# Patient Record
Sex: Female | Born: 1970 | Race: Asian | Hispanic: No | Marital: Married | Smoking: Never smoker
Health system: Southern US, Community
[De-identification: ages and names within clinical notes are randomized; demographics above are authoritative.]

## PROBLEM LIST (undated history)

## (undated) DIAGNOSIS — Z789 Other specified health status: Secondary | ICD-10-CM

---

## 1998-02-23 ENCOUNTER — Inpatient Hospital Stay (HOSPITAL_COMMUNITY): Admission: AD | Admit: 1998-02-23 | Discharge: 1998-02-23 | Payer: Self-pay | Admitting: *Deleted

## 2002-01-22 ENCOUNTER — Emergency Department (HOSPITAL_COMMUNITY): Admission: EM | Admit: 2002-01-22 | Discharge: 2002-01-23 | Payer: Self-pay | Admitting: Emergency Medicine

## 2002-01-23 ENCOUNTER — Encounter: Payer: Self-pay | Admitting: Emergency Medicine

## 2007-05-13 ENCOUNTER — Other Ambulatory Visit: Admission: RE | Admit: 2007-05-13 | Discharge: 2007-05-13 | Payer: Self-pay | Admitting: Gynecology

## 2009-01-23 ENCOUNTER — Emergency Department (HOSPITAL_COMMUNITY): Admission: EM | Admit: 2009-01-23 | Discharge: 2009-01-24 | Payer: Self-pay | Admitting: Emergency Medicine

## 2010-12-04 HISTORY — PX: INTRAUTERINE DEVICE INSERTION: SHX323

## 2011-03-21 LAB — URINALYSIS, ROUTINE W REFLEX MICROSCOPIC
Bilirubin Urine: NEGATIVE
Glucose, UA: NEGATIVE mg/dL
Hgb urine dipstick: NEGATIVE
Nitrite: POSITIVE — AB
Protein, ur: NEGATIVE mg/dL
Specific Gravity, Urine: 1.014 (ref 1.005–1.030)
Urobilinogen, UA: 1 mg/dL (ref 0.0–1.0)
pH: 5.5 (ref 5.0–8.0)

## 2011-03-21 LAB — PREGNANCY, URINE: Preg Test, Ur: NEGATIVE

## 2011-03-21 LAB — URINE MICROSCOPIC-ADD ON

## 2011-04-06 ENCOUNTER — Other Ambulatory Visit (HOSPITAL_COMMUNITY)
Admission: RE | Admit: 2011-04-06 | Discharge: 2011-04-06 | Disposition: A | Discharge status: Home / Self Care | Payer: Self-pay | Source: Ambulatory Visit | Attending: Obstetrics and Gynecology | Admitting: Obstetrics and Gynecology

## 2011-04-06 ENCOUNTER — Other Ambulatory Visit: Payer: Self-pay | Admitting: Obstetrics and Gynecology

## 2011-04-06 DIAGNOSIS — Z124 Encounter for screening for malignant neoplasm of cervix: Secondary | ICD-10-CM | POA: Insufficient documentation

## 2011-04-14 ENCOUNTER — Ambulatory Visit (HOSPITAL_COMMUNITY)
Admission: RE | Admit: 2011-04-14 | Discharge: 2011-04-14 | Disposition: A | Discharge status: Home / Self Care | Payer: BC Managed Care – PPO | Source: Ambulatory Visit | Attending: Obstetrics and Gynecology | Admitting: Obstetrics and Gynecology

## 2011-04-14 DIAGNOSIS — Z302 Encounter for sterilization: Secondary | ICD-10-CM | POA: Insufficient documentation

## 2011-04-14 LAB — CBC
HCT: 36.5 % (ref 36.0–46.0)
Hemoglobin: 11.9 g/dL — ABNORMAL LOW (ref 12.0–15.0)
MCH: 25.2 pg — ABNORMAL LOW (ref 26.0–34.0)
MCHC: 32.6 g/dL (ref 30.0–36.0)
MCV: 77.3 fL — ABNORMAL LOW (ref 78.0–100.0)
Platelets: 203 10*3/uL (ref 150–400)
RBC: 4.72 MIL/uL (ref 3.87–5.11)
RDW: 14.2 % (ref 11.5–15.5)
WBC: 8.1 10*3/uL (ref 4.0–10.5)

## 2011-05-29 NOTE — Op Note (Signed)
  [name], Tracy Branch NO.:  [id number]  MEDICAL RECORD NO.:  [id number]           PATIENT TYPE:  O  LOCATION:  [facility]                          [facility]AC[facility]  PHYSICIAN:  Arlyce Harman, MD     DATE OF BIRTH:  12/24/1970  DATE OF PROCEDURE:  04/14/2011 DATE OF DISCHARGE:    same                              OPERATIVE REPORT   PREOPERATIVE DIAGNOSES:  Laparoscopic tubal coagulation, failed Essure sterilization.  POSTOPERATIVE DIAGNOSES:  Laparoscopic tubal coagulation, failed Essure sterilization.  SURGEON:  Arlyce Harman, MD  PROCEDURE:  Laparoscopic tubal coagulation.  ASSISTANT:  None.  INDICATIONS AND CONSENT:  The patient is a 40 year old parous female who desires elective sterilization.  Risks, possible complication, failure rate, and irreversibility have been explained.  Informed consent was given.  The patient was in the office for Essure sterilization procedure, however, due to the very small diameter of the tubes in this  80 lb Asian petite female, the tubal ostia could only be partially cannulated.  And after several attempts, looking directly at the tubal ostia which allowed  entry then the device stopped before it could be adequately inserted, the procedure was abandoned and the patient was brought into [facility] for laparoscopic tubal coagulation.  PROCEDURE IN DETAIL:  The patient was placed on the table in the supine position.  General anesthesia was induced.  The bladder was drained with a Foley catheter and Hulka tenaculum was placed in the anterior lip of the cervix.  After this was done, we went above and an infraumbilical incision was made, extended to the abdominal layers using direct vision. The peritoneal cavity was entered and CO2 was insufflated.  A second puncture was placed and it was a 5-mm port in the suprapubic area.  The pelvis was visualized.  There was an anterior omental adhesion to the anterior abdominal wall.   The remainder of the uterus, tubes, and ovary appeared normal.  The Kleppinger was inserted and the left tube was identified, traced this, fimbriated, anticoagulated, and 3 successive areas were released.  The exact procedure was done on the other side. At this point, the procedure was terminated and the patient was awakened and taken to recovery in good condition.  SPECIMENS REMOVED:  None.     Arlyce Harman, MD     EG/MEDQ  D:  04/14/2011  T:  04/15/2011  Job:  [id number]  Electronically Signed by Arlyce Harman MD on 05/29/2011 10:36:16 AM

## 2014-03-17 ENCOUNTER — Encounter (HOSPITAL_BASED_OUTPATIENT_CLINIC_OR_DEPARTMENT_OTHER): Payer: Self-pay | Admitting: Emergency Medicine

## 2014-03-17 DIAGNOSIS — Z3202 Encounter for pregnancy test, result negative: Secondary | ICD-10-CM | POA: Insufficient documentation

## 2014-03-17 DIAGNOSIS — N390 Urinary tract infection, site not specified: Secondary | ICD-10-CM | POA: Insufficient documentation

## 2014-03-17 DIAGNOSIS — R109 Unspecified abdominal pain: Secondary | ICD-10-CM | POA: Insufficient documentation

## 2014-03-17 DIAGNOSIS — R509 Fever, unspecified: Secondary | ICD-10-CM | POA: Insufficient documentation

## 2014-03-17 DIAGNOSIS — Z792 Long term (current) use of antibiotics: Secondary | ICD-10-CM | POA: Insufficient documentation

## 2014-03-17 DIAGNOSIS — R52 Pain, unspecified: Secondary | ICD-10-CM | POA: Insufficient documentation

## 2014-03-17 LAB — URINALYSIS, ROUTINE W REFLEX MICROSCOPIC
Bilirubin Urine: NEGATIVE
Glucose, UA: NEGATIVE mg/dL
HGB URINE DIPSTICK: NEGATIVE
Ketones, ur: NEGATIVE mg/dL
LEUKOCYTES UA: NEGATIVE
Nitrite: NEGATIVE
PROTEIN: NEGATIVE mg/dL
Specific Gravity, Urine: 1.022 (ref 1.005–1.030)
UROBILINOGEN UA: 0.2 mg/dL (ref 0.0–1.0)
pH: 7 (ref 5.0–8.0)

## 2014-03-17 NOTE — ED Notes (Signed)
Family reports pt developed  Left flank pain on Friday, seen at urgent care, dx with uti, started on antibiotic, now with increased back pain, urgent care told patient to come be evaluated in er

## 2014-03-18 ENCOUNTER — Emergency Department (HOSPITAL_BASED_OUTPATIENT_CLINIC_OR_DEPARTMENT_OTHER): Payer: BC Managed Care – PPO

## 2014-03-18 ENCOUNTER — Emergency Department (HOSPITAL_BASED_OUTPATIENT_CLINIC_OR_DEPARTMENT_OTHER)
Admission: EM | Admit: 2014-03-18 | Discharge: 2014-03-18 | Disposition: A | Discharge status: Home / Self Care | Payer: BC Managed Care – PPO | Attending: Emergency Medicine | Admitting: Emergency Medicine

## 2014-03-18 DIAGNOSIS — R109 Unspecified abdominal pain: Secondary | ICD-10-CM

## 2014-03-18 LAB — COMPREHENSIVE METABOLIC PANEL
ALT: 13 U/L (ref 0–35)
AST: 21 U/L (ref 0–37)
Albumin: 4.4 g/dL (ref 3.5–5.2)
Alkaline Phosphatase: 55 U/L (ref 39–117)
BUN: 16 mg/dL (ref 6–23)
CALCIUM: 9.8 mg/dL (ref 8.4–10.5)
CO2: 26 meq/L (ref 19–32)
CREATININE: 0.7 mg/dL (ref 0.50–1.10)
Chloride: 99 mEq/L (ref 96–112)
GFR calc Af Amer: >90 mL/min (ref >90)
Glucose, Bld: 101 mg/dL — ABNORMAL HIGH (ref 70–99)
Potassium: 3.9 mEq/L (ref 3.7–5.3)
Sodium: 140 mEq/L (ref 137–147)
Total Bilirubin: 0.5 mg/dL (ref 0.3–1.2)
Total Protein: 8.6 g/dL — ABNORMAL HIGH (ref 6.0–8.3)

## 2014-03-18 LAB — CBC WITH DIFFERENTIAL/PLATELET
Basophils Absolute: 0 10*3/uL (ref 0.0–0.1)
Basophils Relative: 0 % (ref 0–1)
EOS ABS: 0.1 10*3/uL (ref 0.0–0.7)
Eosinophils Relative: 1 % (ref 0–5)
HCT: 36.8 % (ref 36.0–46.0)
HEMOGLOBIN: 12.5 g/dL (ref 12.0–15.0)
LYMPHS ABS: 2.4 10*3/uL (ref 0.7–4.0)
LYMPHS PCT: 36 % (ref 12–46)
MCH: 25.7 pg — AB (ref 26.0–34.0)
MCHC: 34 g/dL (ref 30.0–36.0)
MCV: 75.6 fL — ABNORMAL LOW (ref 78.0–100.0)
MONOS PCT: 9 % (ref 3–12)
Monocytes Absolute: 0.6 10*3/uL (ref 0.1–1.0)
NEUTROS ABS: 3.7 10*3/uL (ref 1.7–7.7)
NEUTROS PCT: 54 % (ref 43–77)
Platelets: 227 10*3/uL (ref 150–400)
RBC: 4.87 MIL/uL (ref 3.87–5.11)
RDW: 13.4 % (ref 11.5–15.5)
WBC: 6.8 10*3/uL (ref 4.0–10.5)

## 2014-03-18 LAB — CK: Total CK: 90 U/L (ref 7–177)

## 2014-03-18 LAB — PREGNANCY, URINE: PREG TEST UR: NEGATIVE

## 2014-03-18 MED ORDER — HYDROCODONE-ACETAMINOPHEN 5-325 MG PO TABS
1.0000 | ORAL_TABLET | Freq: Once | ORAL | Status: AC
  Administered 2014-03-18: 1 via ORAL
  Filled 2014-03-18: qty 1

## 2014-03-18 MED ORDER — HYDROCODONE-ACETAMINOPHEN 5-325 MG PO TABS: 0.5000 | ORAL_TABLET | ORAL | Status: DC | PRN

## 2014-03-18 NOTE — ED Notes (Signed)
Left flank pain, increasing since taking antibiotics.

## 2014-03-18 NOTE — Discharge Instructions (Signed)
Your pain may be due to an object sitting next to your uterus, but not inside the uterus. On CT scan this appears to be an IUD. [facility] contact [facility] to schedule followup appointment for evaluation. Her leg pain may be an adverse reaction to your antibiotic; please discontinue this antibiotic as there is no evidence of a urinary tract infection.

## 2014-03-18 NOTE — ED Notes (Signed)
Patient transported to CT 

## 2014-03-18 NOTE — ED Provider Notes (Signed)
CSN: [id number]     Arrival date & time 03/17/14  2245 History   First MD Initiated Contact with Patient 03/18/14 0254     Chief Complaint  Patient presents with  . Pain in Legs      (Consider location/radiation/quality/duration/timing/severity/associated sxs/prior Treatment) HPI This is a 43 year old female with a five-day history of left flank pain. This has been associated with subjective fever and chills. She was seen in urgent care yesterday and diagnosed with a urinary tract infection. She was given a prescription for Bactrim. She took her first tablet yesterday evening. About 3 hours after taking the Bactrim she developed pain in her calves bilaterally. She describes this as a tightness or pulling. It is worse with palpation or ambulation. She states it makes ambulation difficult. There is no associated swelling. There is no associated weakness. She states the pain in her calves is worse than the pain in her left flank. It is described as moderate to severe.  History reviewed. No pertinent past medical history. History reviewed. No pertinent past surgical history. History reviewed. No pertinent family history. History  Substance Use Topics  . Smoking status: Never Smoker   . Smokeless tobacco: Not on file  . Alcohol Use: No   OB History   Grav Para Term Preterm Abortions TAB SAB Ect Mult Living                 Review of Systems  All other systems reviewed and are negative.     Allergies  Review of patient's allergies indicates no known allergies.  Home Medications   Prior to Admission medications   Medication Sig Start Date End Date Taking? Authorizing Provider  sulfamethoxazole-trimethoprim (BACTRIM DS,SEPTRA DS) 800-160 MG per tablet Take 1 tablet by mouth 2 (two) times daily.   Yes Historical Provider, MD   BP 97/54  Temp(Src) 98.6 F (37 C) (Oral)  Resp 20  Ht 5' (1.524 m)  Wt 99 lb (44.906 kg)  BMI 19.33 kg/m2  SpO2 100%  LMP 02/18/2014  Physical  Exam General: Well-developed, well-nourished female in no acute distress; appearance consistent with age of record HENT: normocephalic; atraumatic Eyes: pupils equal, round and reactive to light; extraocular muscles intact Neck: supple Heart: regular rate and rhythm Lungs: clear to auscultation bilaterally Abdomen: soft; nondistended; nontender; no masses or hepatosplenomegaly; bowel sounds present GU: left CVA tendernes Extremities: No deformity; full range of motion; pulses normal; no edema; tenderness of calf muscles Neurologic: Awake, alert; motor function intact in all extremities and symmetric; no facial droop; normal gait, coordination and speech Skin: Warm and dry    ED Course  Procedures (including critical care time)  MDM   Nursing notes and vitals signs, including pulse oximetry, reviewed.  Summary of this visit's results, reviewed by myself:  Labs:  Results for orders placed during the hospital encounter of 03/18/14 (from the past 24 hour(s))  URINALYSIS, ROUTINE W REFLEX MICROSCOPIC     Status: Abnormal   Collection Time    03/17/14 10:58 PM      Result Value Ref Range   Color, Urine YELLOW  YELLOW   APPearance CLOUDY (*) CLEAR   Specific Gravity, Urine 1.022  1.005 - 1.030   pH 7.0  5.0 - 8.0   Glucose, UA NEGATIVE  NEGATIVE mg/dL   Hgb urine dipstick NEGATIVE  NEGATIVE   Bilirubin Urine NEGATIVE  NEGATIVE   Ketones, ur NEGATIVE  NEGATIVE mg/dL   Protein, ur NEGATIVE  NEGATIVE mg/dL   Urobilinogen,  UA 0.2  0.0 - 1.0 mg/dL   Nitrite NEGATIVE  NEGATIVE   Leukocytes, UA NEGATIVE  NEGATIVE  PREGNANCY, URINE     Status: None   Collection Time    03/17/14 10:58 PM      Result Value Ref Range   Preg Test, Ur NEGATIVE  NEGATIVE  COMPREHENSIVE METABOLIC PANEL     Status: Abnormal   Collection Time    03/18/14  3:21 AM      Result Value Ref Range   Sodium 140  137 - 147 mEq/L   Potassium 3.9  3.7 - 5.3 mEq/L   Chloride 99  96 - 112 mEq/L   CO2 26  19 - 32  mEq/L   Glucose, Bld 101 (*) 70 - 99 mg/dL   BUN 16  6 - 23 mg/dL   Creatinine, Ser 9.60  0.50 - 1.10 mg/dL   Calcium 9.8  8.4 - 45.4 mg/dL   Total Protein 8.6 (*) 6.0 - 8.3 g/dL   Albumin 4.4  3.5 - 5.2 g/dL   AST 21  0 - 37 U/L   ALT 13  0 - 35 U/L   Alkaline Phosphatase 55  39 - 117 U/L   Total Bilirubin 0.5  0.3 - 1.2 mg/dL   GFR calc non Af Amer >90  >90 mL/min   GFR calc Af Amer >90  >90 mL/min  CBC WITH DIFFERENTIAL     Status: Abnormal   Collection Time    03/18/14  3:21 AM      Result Value Ref Range   WBC 6.8  4.0 - 10.5 K/uL   RBC 4.87  3.87 - 5.11 MIL/uL   Hemoglobin 12.5  12.0 - 15.0 g/dL   HCT 09.8  11.9 - 14.7 %   MCV 75.6 (*) 78.0 - 100.0 fL   MCH 25.7 (*) 26.0 - 34.0 pg   MCHC 34.0  30.0 - 36.0 g/dL   RDW 82.9  56.2 - 13.0 %   Platelets 227  150 - 400 K/uL   Neutrophils Relative % 54  43 - 77 %   Neutro Abs 3.7  1.7 - 7.7 K/uL   Lymphocytes Relative 36  12 - 46 %   Lymphs Abs 2.4  0.7 - 4.0 K/uL   Monocytes Relative 9  3 - 12 %   Monocytes Absolute 0.6  0.1 - 1.0 K/uL   Eosinophils Relative 1  0 - 5 %   Eosinophils Absolute 0.1  0.0 - 0.7 K/uL   Basophils Relative 0  0 - 1 %   Basophils Absolute 0.0  0.0 - 0.1 K/uL  CK     Status: None   Collection Time    03/18/14  3:21 AM      Result Value Ref Range   Total CK 90  7 - 177 U/L    Imaging Studies: Ct Abdomen Pelvis Wo Contrast  03/18/2014   CLINICAL DATA:  Left flank pain  EXAM: CT ABDOMEN AND PELVIS WITHOUT CONTRAST  TECHNIQUE: Multidetector CT imaging of the abdomen and pelvis was performed following the standard protocol without intravenous contrast.  COMPARISON:  None.  FINDINGS: The lung bases are clear.  No renal, ureteral, or bladder calculi. No obstructive uropathy. No perinephric stranding is seen. The kidneys are symmetric in size without evidence for exophytic mass. The bladder is unremarkable.  The liver demonstrates no focal abnormality. The gallbladder is unremarkable. The spleen  demonstrates no focal abnormality. The adrenal glands and pancreas  are normal.  The unopacified stomach, duodenum, small intestine and large intestine are unremarkable, but evaluation is limited by lack of oral contrast. There is no pneumoperitoneum, pneumatosis, or portal venous gas. There is no abdominal or pelvic free fluid. There is no lymphadenopathy. There is a malpositioned intrauterine device which appears to the outside uterus located along the left lateral aspect of the uterus.  The abdominal aorta is normal in caliber .  The osseous structures are unremarkable.  IMPRESSION: Malpositioned intrauterine device which is outside the uterus abutting the left lateral aspect of the uterus.   Electronically Signed   By: Elige KoHetal  Patel   On: 03/18/2014 03:27   4:08 AM The patient states she has never had an IUD. An OB/GYN attempted to place Essure 3 years ago but was unsuccessful and performed a bilateral tubal ligation with cauterization. No mention was made about an IUD. It is suspicious that the patient's pain is on the left side. There is no evidence of ureteral stone or urinary tract infection. Her muscle pain is likely due to an adverse reaction to the Bactrim and we will have her discontinue it as there is no evidence of a urinary tract infection. We will treat her pain and refer to [facility] for further evaluation and treatment.     Hanley SeamenJohn L Mignonne Afonso, MD 03/18/14 [phone]

## 2014-03-18 NOTE — ED Notes (Signed)
MD at bedside. 

## 2014-03-18 NOTE — ED Notes (Signed)
MD at bedside, findings discussed at length with pt and family.

## 2014-03-19 ENCOUNTER — Ambulatory Visit (HOSPITAL_COMMUNITY)
Admission: RE | Admit: 2014-03-19 | Discharge: 2014-03-19 | Disposition: A | Discharge status: Home / Self Care | Payer: BC Managed Care – PPO | Source: Ambulatory Visit | Attending: Obstetrics & Gynecology | Admitting: Obstetrics & Gynecology

## 2014-03-19 ENCOUNTER — Ambulatory Visit (INDEPENDENT_AMBULATORY_CARE_PROVIDER_SITE_OTHER): Payer: BC Managed Care – PPO | Admitting: Obstetrics & Gynecology

## 2014-03-19 ENCOUNTER — Encounter: Payer: Self-pay | Admitting: Obstetrics & Gynecology

## 2014-03-19 ENCOUNTER — Other Ambulatory Visit: Payer: Self-pay | Admitting: Obstetrics & Gynecology

## 2014-03-19 VITALS — BP 122/84 | HR 96 | Temp 96.5°F | Ht 59.0 in | Wt 97.3 lb

## 2014-03-19 DIAGNOSIS — T8332XA Displacement of intrauterine contraceptive device, initial encounter: Secondary | ICD-10-CM

## 2014-03-19 DIAGNOSIS — T8339XA Other mechanical complication of intrauterine contraceptive device, initial encounter: Secondary | ICD-10-CM

## 2014-03-19 DIAGNOSIS — Z30431 Encounter for routine checking of intrauterine contraceptive device: Secondary | ICD-10-CM | POA: Insufficient documentation

## 2014-03-19 NOTE — Patient Instructions (Signed)
Intrauterine Device Information An intrauterine device (IUD) is inserted into your uterus to prevent pregnancy. There are two types of IUDs available:   Copper IUD This type of IUD is wrapped in copper wire and is placed inside the uterus. Copper makes the uterus and fallopian tubes produce a fluid that kills sperm. The copper IUD can stay in place for 10 years.  Hormone IUD This type of IUD contains the hormone progestin (synthetic progesterone). The hormone thickens the cervical mucus and prevents sperm from entering the uterus. It also thins the uterine lining to prevent implantation of a fertilized egg. The hormone can weaken or kill the sperm that get into the uterus. One type of hormone IUD can stay in place for 5 years, and another type can stay in place for 3 years. Your health care provider will make sure you are a good candidate for a contraceptive IUD. Discuss with your health care provider the possible side effects.  ADVANTAGES OF AN INTRAUTERINE DEVICE  IUDs are highly effective, reversible, long acting, and low maintenance.   There are no estrogen-related side effects.   An IUD can be used when breastfeeding.   IUDs are not associated with weight gain.   The copper IUD works immediately after insertion.   The hormone IUD works right away if inserted within 7 days of your period starting. You will need to use a backup method of birth control for 7 days if the hormone IUD is inserted at any other time in your cycle.  The copper IUD does not interfere with your female hormones.   The hormone IUD can make heavy menstrual periods lighter and decrease cramping.   The hormone IUD can be used for 3 or 5 years.   The copper IUD can be used for 10 years. DISADVANTAGES OF AN INTRAUTERINE DEVICE  The hormone IUD can be associated with irregular bleeding patterns.   The copper IUD can make your menstrual flow heavier and more painful.   You may experience cramping and  vaginal bleeding after insertion.  Document Released: 10/24/2004 Document Revised: 07/23/2013 Document Reviewed: 05/11/2013 [employer] Patient Information 2014 [employer].  

## 2014-03-19 NOTE — Progress Notes (Signed)
Subjective:     Patient ID: Tracy Branch, female   DOB: Oct 04, 1971, 43 y.o.   MRN: [id number]  HPI Pt was seen yesterday for abd/pelvic pain. A CT scan was done which revealed that the IUD appears to be outside of the uterus.  Pt reports that the IUD was placed over 3 years ago.  She denies abnormal bleeding.  Interpretation was difficult as the phone interpreter could not hear and her daughter does not speak her language well and she does not speak [place] fluently.  Pt denies N/V/F/C    Review of Systems     Objective:   Physical Exam BP 122/84  Pulse 96  Temp(Src) 96.5 F (35.8 C) (Oral)  Ht 4\' 11"  (1.499 m)  Wt 97 lb 4.8 oz (44.135 kg)  BMI 19.64 kg/m2  LMP 02/18/2014  Pt in NAD Abd; soft, NT, ND.  well healed transverse incision GU: EGBUS: no lesions Vagina: no blood in vault Cervix: no lesion; no mucopurulent d/c; no strings noted Uterus: small, mobile Adnexa: no masses; non tender   03/18/2014 EXAM:  CT ABDOMEN AND PELVIS WITHOUT CONTRAST  TECHNIQUE:  Multidetector CT imaging of the abdomen and pelvis was performed  following the standard protocol without intravenous contrast.  COMPARISON: None.  FINDINGS:  The lung bases are clear.  No renal, ureteral, or bladder calculi. No obstructive uropathy. No  perinephric stranding is seen. The kidneys are symmetric in size  without evidence for exophytic mass. The bladder is unremarkable.  The liver demonstrates no focal abnormality. The gallbladder is  unremarkable. The spleen demonstrates no focal abnormality. The  adrenal glands and pancreas are normal.  The unopacified stomach, duodenum, small intestine and large  intestine are unremarkable, but evaluation is limited by lack of  oral contrast. There is no pneumoperitoneum, pneumatosis, or portal  venous gas. There is no abdominal or pelvic free fluid. There is no  lymphadenopathy. There is a malpositioned intrauterine device which  appears to the outside  uterus located along the left lateral aspect  of the uterus.  The abdominal aorta is normal in caliber .  The osseous structures are unremarkable.  IMPRESSION:  Malpositioned intrauterine device which is outside the uterus  abutting the left lateral aspect of the uterus.  .      Assessment:     Pt referred for possible IUD in pelvis OUTSIDE of uterus    Plan:     Refer for 3D U/S now  Addendum: Pt s/p 3 D sono.  There is an IUD in the myometrium extending THROUGH to the pelvis.  It appears that the long axis is the wall and one of the arms is through the wall.  There is NO intrauterine IUD. (final results pending.  CT and sono results reviewed in person with the radiologist).  Patient desires surgical management with laparoscopy for removal of foreign body. She was told that she will be contacted by our surgical scheduler regarding the time and date of her surgery; routine preoperative instructions of having nothing to eat or drink after midnight on the day prior to surgery and also coming to the hospital 1 1/2 hours prior to her time of surgery were also emphasized.  She was told she may be called for a preoperative appointment about a week prior to surgery and will be given further preoperative instructions at that visit. Printed patient education handouts about the procedure were given to the patient to review at home.  Full consent was not possible due  to lack of suitable interpreter.  Have requested an in person interpreter at the time of surgery.

## 2014-03-24 ENCOUNTER — Ambulatory Visit (HOSPITAL_COMMUNITY): Payer: BC Managed Care – PPO | Admitting: Certified Registered Nurse Anesthetist

## 2014-03-24 ENCOUNTER — Encounter (HOSPITAL_COMMUNITY): Payer: BC Managed Care – PPO | Admitting: Certified Registered Nurse Anesthetist

## 2014-03-24 ENCOUNTER — Ambulatory Visit (HOSPITAL_COMMUNITY)
Admission: RE | Admit: 2014-03-24 | Discharge: 2014-03-24 | Disposition: A | Discharge status: Home / Self Care | Payer: BC Managed Care – PPO | Source: Ambulatory Visit | Attending: Obstetrics & Gynecology | Admitting: Obstetrics & Gynecology

## 2014-03-24 ENCOUNTER — Encounter (HOSPITAL_COMMUNITY): Payer: Self-pay | Admitting: *Deleted

## 2014-03-24 ENCOUNTER — Encounter (HOSPITAL_COMMUNITY)
Admission: RE | Disposition: A | Discharge status: Home / Self Care | Payer: Self-pay | Source: Ambulatory Visit | Attending: Obstetrics & Gynecology

## 2014-03-24 DIAGNOSIS — N949 Unspecified condition associated with female genital organs and menstrual cycle: Secondary | ICD-10-CM | POA: Insufficient documentation

## 2014-03-24 DIAGNOSIS — Z3049 Encounter for surveillance of other contraceptives: Secondary | ICD-10-CM | POA: Insufficient documentation

## 2014-03-24 DIAGNOSIS — R102 Pelvic and perineal pain unspecified side: Secondary | ICD-10-CM

## 2014-03-24 DIAGNOSIS — T193XXA Foreign body in uterus, initial encounter: Secondary | ICD-10-CM

## 2014-03-24 DIAGNOSIS — S3760XA Unspecified injury of uterus, initial encounter: Secondary | ICD-10-CM | POA: Insufficient documentation

## 2014-03-24 HISTORY — DX: Other specified health status: Z78.9

## 2014-03-24 HISTORY — PX: LAPAROSCOPY: SHX197

## 2014-03-24 LAB — CBC
HEMATOCRIT: 36 % (ref 36.0–46.0)
HEMOGLOBIN: 12 g/dL (ref 12.0–15.0)
MCH: 25.2 pg — ABNORMAL LOW (ref 26.0–34.0)
MCHC: 33.3 g/dL (ref 30.0–36.0)
MCV: 75.6 fL — AB (ref 78.0–100.0)
Platelets: 247 10*3/uL (ref 150–400)
RBC: 4.76 MIL/uL (ref 3.87–5.11)
RDW: 13.3 % (ref 11.5–15.5)
WBC: 6.7 10*3/uL (ref 4.0–10.5)

## 2014-03-24 SURGERY — LAPAROSCOPY OPERATIVE
Anesthesia: General | Site: Abdomen

## 2014-03-24 MED ORDER — ROCURONIUM BROMIDE 100 MG/10ML IV SOLN
INTRAVENOUS | Status: DC | PRN
  Administered 2014-03-24: 20 mg via INTRAVENOUS

## 2014-03-24 MED ORDER — NEOSTIGMINE METHYLSULFATE 1 MG/ML IJ SOLN
INTRAMUSCULAR | Status: AC
  Filled 2014-03-24: qty 1

## 2014-03-24 MED ORDER — KETOROLAC TROMETHAMINE 30 MG/ML IJ SOLN: 15.0000 mg | Freq: Once | INTRAMUSCULAR | Status: DC | PRN

## 2014-03-24 MED ORDER — KETOROLAC TROMETHAMINE 30 MG/ML IJ SOLN
INTRAMUSCULAR | Status: DC | PRN
  Administered 2014-03-24: 30 mg via INTRAVENOUS

## 2014-03-24 MED ORDER — BUPIVACAINE HCL (PF) 0.5 % IJ SOLN
INTRAMUSCULAR | Status: DC | PRN
  Administered 2014-03-24: 19 mL

## 2014-03-24 MED ORDER — NEOSTIGMINE METHYLSULFATE 1 MG/ML IJ SOLN
INTRAMUSCULAR | Status: DC | PRN
  Administered 2014-03-24: 2 mg via INTRAVENOUS

## 2014-03-24 MED ORDER — BUPIVACAINE HCL (PF) 0.5 % IJ SOLN
INTRAMUSCULAR | Status: AC
  Filled 2014-03-24: qty 30

## 2014-03-24 MED ORDER — BUPIVACAINE HCL (PF) 0.25 % IJ SOLN
INTRAMUSCULAR | Status: AC
  Filled 2014-03-24: qty 30

## 2014-03-24 MED ORDER — LACTATED RINGERS IV SOLN
INTRAVENOUS | Status: DC
  Administered 2014-03-24 (×2): via INTRAVENOUS

## 2014-03-24 MED ORDER — GLYCOPYRROLATE 0.2 MG/ML IJ SOLN
INTRAMUSCULAR | Status: DC | PRN
  Administered 2014-03-24: 0.2 mg via INTRAVENOUS
  Administered 2014-03-24: .4 mg via INTRAVENOUS

## 2014-03-24 MED ORDER — MEPERIDINE HCL 25 MG/ML IJ SOLN: 6.2500 mg | INTRAMUSCULAR | Status: DC | PRN

## 2014-03-24 MED ORDER — SODIUM CHLORIDE 0.9 % IJ SOLN
INTRAMUSCULAR | Status: DC | PRN
  Administered 2014-03-24: 3 mL

## 2014-03-24 MED ORDER — ONDANSETRON HCL 4 MG/2ML IJ SOLN
INTRAMUSCULAR | Status: AC
  Filled 2014-03-24: qty 2

## 2014-03-24 MED ORDER — SODIUM CHLORIDE 0.9 % IJ SOLN
INTRAMUSCULAR | Status: AC
  Filled 2014-03-24: qty 10

## 2014-03-24 MED ORDER — KETOROLAC TROMETHAMINE 30 MG/ML IJ SOLN
INTRAMUSCULAR | Status: AC
  Filled 2014-03-24: qty 1

## 2014-03-24 MED ORDER — GLYCOPYRROLATE 0.2 MG/ML IJ SOLN
INTRAMUSCULAR | Status: AC
  Filled 2014-03-24: qty 3

## 2014-03-24 MED ORDER — FENTANYL CITRATE 0.05 MG/ML IJ SOLN
INTRAMUSCULAR | Status: AC
  Filled 2014-03-24: qty 2

## 2014-03-24 MED ORDER — ROCURONIUM BROMIDE 100 MG/10ML IV SOLN
INTRAVENOUS | Status: AC
  Filled 2014-03-24: qty 1

## 2014-03-24 MED ORDER — PROPOFOL 10 MG/ML IV EMUL
INTRAVENOUS | Status: AC
  Filled 2014-03-24: qty 20

## 2014-03-24 MED ORDER — LIDOCAINE HCL (CARDIAC) 20 MG/ML IV SOLN
INTRAVENOUS | Status: AC
  Filled 2014-03-24: qty 5

## 2014-03-24 MED ORDER — MIDAZOLAM HCL 2 MG/2ML IJ SOLN
INTRAMUSCULAR | Status: AC
  Filled 2014-03-24: qty 2

## 2014-03-24 MED ORDER — FENTANYL CITRATE 0.05 MG/ML IJ SOLN
25.0000 ug | INTRAMUSCULAR | Status: DC | PRN
  Administered 2014-03-24: 25 ug via INTRAVENOUS

## 2014-03-24 MED ORDER — PROPOFOL 10 MG/ML IV BOLUS
INTRAVENOUS | Status: DC | PRN
  Administered 2014-03-24: 120 mg via INTRAVENOUS

## 2014-03-24 MED ORDER — FENTANYL CITRATE 0.05 MG/ML IJ SOLN
INTRAMUSCULAR | Status: AC
  Filled 2014-03-24: qty 5

## 2014-03-24 MED ORDER — LIDOCAINE HCL (CARDIAC) 20 MG/ML IV SOLN
INTRAVENOUS | Status: DC | PRN
  Administered 2014-03-24: 40 mg via INTRAVENOUS

## 2014-03-24 MED ORDER — IBUPROFEN 600 MG PO TABS: 600.0000 mg | ORAL_TABLET | Freq: Four times a day (QID) | ORAL | Status: AC | PRN

## 2014-03-24 MED ORDER — ONDANSETRON HCL 4 MG/2ML IJ SOLN: 4.0000 mg | Freq: Once | INTRAMUSCULAR | Status: DC | PRN

## 2014-03-24 MED ORDER — FENTANYL CITRATE 0.05 MG/ML IJ SOLN
INTRAMUSCULAR | Status: DC | PRN
  Administered 2014-03-24 (×3): 50 ug via INTRAVENOUS

## 2014-03-24 MED ORDER — DEXAMETHASONE SODIUM PHOSPHATE 10 MG/ML IJ SOLN
INTRAMUSCULAR | Status: AC
  Filled 2014-03-24: qty 1

## 2014-03-24 MED ORDER — ONDANSETRON HCL 4 MG/2ML IJ SOLN
INTRAMUSCULAR | Status: DC | PRN
  Administered 2014-03-24: 4 mg via INTRAVENOUS

## 2014-03-24 MED ORDER — MIDAZOLAM HCL 2 MG/2ML IJ SOLN
INTRAMUSCULAR | Status: DC | PRN
  Administered 2014-03-24: 1 mg via INTRAVENOUS

## 2014-03-24 MED ORDER — DEXAMETHASONE SODIUM PHOSPHATE 10 MG/ML IJ SOLN
INTRAMUSCULAR | Status: DC | PRN
  Administered 2014-03-24: 5 mg via INTRAVENOUS

## 2014-03-24 SURGICAL SUPPLY — 35 items
APL SKNCLS STERI-STRIP NONHPOA (GAUZE/BANDAGES/DRESSINGS) ×1
BAG SPEC RTRVL LRG 6X4 10 (ENDOMECHANICALS)
BENZOIN TINCTURE PRP APPL 2/3 (GAUZE/BANDAGES/DRESSINGS) ×4 IMPLANT
CABLE HIGH FREQUENCY MONO STRZ (ELECTRODE) IMPLANT
CATH ROBINSON RED A/P 16FR (CATHETERS) ×4 IMPLANT
CHLORAPREP W/TINT 26ML (MISCELLANEOUS) ×4 IMPLANT
CLOSURE WOUND 1/2 X4 (GAUZE/BANDAGES/DRESSINGS) ×1
CLOTH BEACON ORANGE TIMEOUT ST (SAFETY) ×4 IMPLANT
CONTAINER PREFILL 10% NBF 60ML (FORM) ×4 IMPLANT
GLOVE BIO SURGEON STRL SZ7 (GLOVE) ×4 IMPLANT
GLOVE BIOGEL PI IND STRL 7.0 (GLOVE) ×2 IMPLANT
GLOVE BIOGEL PI INDICATOR 7.0 (GLOVE) ×2
GOWN STRL REUS W/TWL LRG LVL3 (GOWN DISPOSABLE) ×8 IMPLANT
MANIPULATOR UTERINE 4.5 ZUMI (MISCELLANEOUS) ×4 IMPLANT
NEEDLE INSUFFLATION 120MM (ENDOMECHANICALS) ×4 IMPLANT
NEEDLE SPNL 22GX3.5 QUINCKE BK (NEEDLE) IMPLANT
NS IRRIG 1000ML POUR BTL (IV SOLUTION) ×4 IMPLANT
PACK LAPAROSCOPY BASIN (CUSTOM PROCEDURE TRAY) ×4 IMPLANT
PACK VAGINAL MINOR WOMEN LF (CUSTOM PROCEDURE TRAY) ×4 IMPLANT
PAD PREP 24X48 CUFFED NSTRL (MISCELLANEOUS) ×4 IMPLANT
POUCH SPECIMEN RETRIEVAL 10MM (ENDOMECHANICALS) IMPLANT
PROTECTOR NERVE ULNAR (MISCELLANEOUS) ×4 IMPLANT
SET IRRIG TUBING LAPAROSCOPIC (IRRIGATION / IRRIGATOR) IMPLANT
SHEARS HARMONIC ACE PLUS 36CM (ENDOMECHANICALS) ×4 IMPLANT
STRIP CLOSURE SKIN 1/2X4 (GAUZE/BANDAGES/DRESSINGS) ×3 IMPLANT
SUT VIC AB 3-0 X1 27 (SUTURE) ×4 IMPLANT
SUT VICRYL 0 UR6 27IN ABS (SUTURE) ×4 IMPLANT
SUT VICRYL 4-0 PS2 18IN ABS (SUTURE) IMPLANT
SYR CONTROL 10ML LL (SYRINGE) ×4 IMPLANT
TOWEL OR 17X24 6PK STRL BLUE (TOWEL DISPOSABLE) ×8 IMPLANT
TRAY FOLEY CATH 14FR (SET/KITS/TRAYS/PACK) IMPLANT
TROCAR XCEL OPT SLVE 5M 100M (ENDOMECHANICALS) ×4 IMPLANT
TROCAR Z-THREAD BLADED 11X100M (TROCAR) ×4 IMPLANT
TROCAR Z-THREAD BLADED 5X100MM (TROCAR) ×4 IMPLANT
WATER STERILE IRR 1000ML POUR (IV SOLUTION) IMPLANT

## 2014-03-24 NOTE — Op Note (Signed)
Tracy SanesChanthana Olvey  03/24/2014  2:24 PM  PATIENT:  Tracy Branch Lwin  43 y.o. female  PRE-OPERATIVE DIAGNOSIS:  MALPLACED IUD  POST-OPERATIVE DIAGNOSIS:  Malplaced Esure Coil   PROCEDURE:  Procedure(s): LAPAROSCOPY with Esure coil removal (Left); lysis of adhesions  SURGEON:  Surgeon(s) and Role:    * Willodean Rosenthalarolyn Harraway-Smith, MD - Primary  ANESTHESIA:   general  EBL:  Total I/O In: 1100 [I.V.:1100] Out: 85 [Urine:75; Blood:10]  BLOOD ADMINISTERED:none  DRAINS: none   LOCAL MEDICATIONS USED:  MARCAINE     SPECIMEN:  Source of Specimen:  left cornual region Essure coil  DISPOSITION OF SPECIMEN:  PATHOLOGY  COUNTS:  YES  TOURNIQUET:  * No tourniquets in log *  DICTATION: .Note written in EPIC  PLAN OF CARE: Discharge to home after PACU  PATIENT DISPOSITION:  PACU - hemodynamically stable.   Delay start of Pharmacological VTE agent (>24hrs) due to surgical blood loss or risk of bleeding: not applicable  INDICATIONS: 43 y.o. Presents for removal of perforated foreign body from left lateral aspect of uterus.  Please refer to preoperative notes for more details. Patient was counseled regarding need for laparoscopic resection of what she said was an IUD.  Risks of surgery including bleeding which may require transfusion or reoperation, infection, injury to bowel or other surrounding organs, need for additional procedures including laparotomy and other postoperative/anesthesia complications were explained to patient.  Written informed consent was obtained.  FINDINGS:  ESSURE coil noted perforating the left cornual region.  There was also adhesions of omentum to the anterior abdominal wall.   COMPLICATIONS: None immediate  PROCEDURE IN DETAIL:  The patient was taken to the operating room where general anesthesia was administered and was found to be adequate.  She was placed in the dorsal lithotomy position, and was prepped and draped in a sterile manner.  Her bladder  was drained with a red rubber catheter. A uterine manipulator was then advanced into the uterus .  After an adequate timeout was performed, attention was then turned to the patient's abdomen where a 5-mm skin incision was made on the umbilical fold.  The Veress needle was carefully introduced into the peritoneal cavity at a 45-degree angle into the abdominal wall.  Intraperitoneal placement was confirmed by drop in intraabdominal pressure with insufflation of carbon dioxide gas.  Adequate pneumoperitoneum was obtained, and the 5-mm trocar and sleeve were then advanced without difficulty into the abdomen where intraabdominal placement was confirmed by the laparoscope. A survey of the patient's pelvis and abdomen revealed the findings as above.  A 5-mm left lower quadrant port was placed under direct visualization.  The omental adhesions were taken down with sharp dissection.  Attention was then turned to the left cornual region where the coil was grasped with forceps and removed from the uterus and fallopian tube. Excellent hemostasis was noted.  The abdomen was desufflated, and all instruments were removed.  All skin incisions were closed with with benzoin and steri-strips   The patient tolerated the procedure well.  All instruments, needles, and sponge counts were correct x 2. The patient was taken to the recovery room in stable condition.   The patient will be discharged to home as per PACU criteria.  Routine postoperative instructions given.  She was prescribed Ibuprofen.   She will follow up in the clinic in about 2 weeks for postoperative evaluation.

## 2014-03-24 NOTE — Anesthesia Postprocedure Evaluation (Signed)
Anesthesia Post Note  Patient: Tracy Branch  Procedure(s) Performed: Procedure(s) (LRB): LAPAROSCOPY with Esure coil removal (Left)  Anesthesia type: General  Patient location: PACU  Post pain: Pain level controlled  Post assessment: Post-op Vital signs reviewed  Last Vitals:  Filed Vitals:   03/24/14 1500  BP: 106/52  Pulse: 54  Temp:   Resp: 20    Post vital signs: Reviewed  Level of consciousness: sedated  Complications: No apparent anesthesia complications

## 2014-03-24 NOTE — Anesthesia Preprocedure Evaluation (Signed)
Anesthesia Evaluation  Patient identified by MRN, date of birth, ID band Patient awake    Reviewed: Allergy & Precautions, H&P , NPO status , Patient's Chart, lab work & pertinent test results  Airway Mallampati: I TM Distance: >3 FB Neck ROM: full    Dental no notable dental hx. (+) Teeth Intact   Pulmonary neg pulmonary ROS,    Pulmonary exam normal       Cardiovascular negative cardio ROS      Neuro/Psych negative neurological ROS  negative psych ROS   GI/Hepatic negative GI ROS, Neg liver ROS,   Endo/Other  negative endocrine ROS  Renal/GU negative Renal ROS     Musculoskeletal   Abdominal Normal abdominal exam  (+)   Peds  Hematology negative hematology ROS (+)   Anesthesia Other Findings   Reproductive/Obstetrics negative OB ROS                           Anesthesia Physical Anesthesia Plan  ASA: I  Anesthesia Plan: General   Post-op Pain Management:    Induction: Intravenous  Airway Management Planned: Oral ETT  Additional Equipment:   Intra-op Plan:   Post-operative Plan: Extubation in OR  Informed Consent: I have reviewed the patients History and Physical, chart, labs and discussed the procedure including the risks, benefits and alternatives for the proposed anesthesia with the patient or authorized representative who has indicated his/her understanding and acceptance.   Dental Advisory Given  Plan Discussed with: CRNA and Surgeon  Anesthesia Plan Comments:         Anesthesia Quick Evaluation

## 2014-03-24 NOTE — H&P (Signed)
  HPI Pt was seen yesterday for abd/pelvic pain. A CT scan was done which revealed that the IUD appears to be outside of the uterus. Pt reports that the IUD was placed over 3 years ago. She denies abnormal bleeding. Interpretation was difficult as the phone interpreter could not hear and her daughter does not speak her language well and she does not speak [place] fluently.  Pt denies N/V/F/C  Review of Systems  Objective:   Physical Exam BP 122/84  Pulse 96  Temp(Src) 96.5 F (35.8 C) (Oral)  Ht 4\' 11"  (1.499 m)  Wt 97 lb 4.8 oz (44.135 kg)  BMI 19.64 kg/m2  LMP 02/18/2014  Pt in NAD  Abd; soft, NT, ND. well healed transverse incision  GU:  EGBUS: no lesions  Vagina: no blood in vault  Cervix: no lesion; no mucopurulent d/c; no strings noted  Uterus: small, mobile  Adnexa: no masses; non tender  03/18/2014  EXAM:  CT ABDOMEN AND PELVIS WITHOUT CONTRAST  TECHNIQUE:  Multidetector CT imaging of the abdomen and pelvis was performed  following the standard protocol without intravenous contrast.  COMPARISON: None.  FINDINGS:  The lung bases are clear.  No renal, ureteral, or bladder calculi. No obstructive uropathy. No  perinephric stranding is seen. The kidneys are symmetric in size  without evidence for exophytic mass. The bladder is unremarkable.  The liver demonstrates no focal abnormality. The gallbladder is  unremarkable. The spleen demonstrates no focal abnormality. The  adrenal glands and pancreas are normal.  The unopacified stomach, duodenum, small intestine and large  intestine are unremarkable, but evaluation is limited by lack of  oral contrast. There is no pneumoperitoneum, pneumatosis, or portal  venous gas. There is no abdominal or pelvic free fluid. There is no  lymphadenopathy. There is a malpositioned intrauterine device which  appears to the outside uterus located along the left lateral aspect  of the uterus.  The abdominal aorta is normal in caliber .  The  osseous structures are unremarkable.  IMPRESSION:  Malpositioned intrauterine device which is outside the uterus  abutting the left lateral aspect of the uterus.  .  Assessment:   Pt referred for possible IUD in pelvis OUTSIDE of uterus  Plan:   Refer for 3D U/S now  Addendum:  Pt s/p 3 D sono. There is an IUD in the myometrium extending THROUGH to the pelvis. It appears that the long axis is the wall and one of the arms is through the wall. There is NO intrauterine IUD.  (final results pending. CT and sono results reviewed in person with the radiologist).  The risks of surgery were discussed in detail with the patient including but not limited to: bleeding which may require transfusion or reoperation; infection which may require prolonged hospitalization or re-hospitalization and antibiotic therapy; injury to bowel, bladder, ureters and major vessels or other surrounding organs; need for additional procedures including laparotomy; thromboembolic phenomenon, incisional problems and other postoperative or anesthesia complications.  Patient was told that the likelihood that her condition and symptoms will be treated effectively with this surgical management was very high; the postoperative expectations were also discussed in detail. The patient also understands the alternative treatment options which were discussed in full. All questions were answered.

## 2014-03-24 NOTE — Discharge Instructions (Signed)
DISCHARGE INSTRUCTIONS: Laparoscopy  MAY TAKE IBUPROFEN (MOTRIN, ADVIL) OR ALEVE AFTER 7:45 PM FOR PAIN!!!  The following instructions have been prepared to help you care for yourself upon your return home today.  Wound care:  Do not get the incision wet for the first 24 hours. The incision should be kept clean and dry.  The Band-Aids or dressings may be removed the day after surgery.  Should the incision become sore, red, and swollen after the first week, check with your doctor.  Personal hygiene:  Shower the day after your procedure.  Activity and limitations:  Do NOT drive or operate any equipment today.  Do NOT lift anything more than 15 pounds for 2-3 weeks after surgery.  Do NOT rest in bed all day.  Walking is encouraged. Walk each day, starting slowly with 5-minute walks 3 or 4 times a day. Slowly increase the length of your walks.  Walk up and down stairs slowly.  Do NOT do strenuous activities, such as golfing, playing tennis, bowling, running, biking, weight lifting, gardening, mowing, or vacuuming for 2-4 weeks. Ask your doctor when it is okay to start.  Diet: Eat a light meal as desired this evening. You may resume your usual diet tomorrow.  Return to work: This is dependent on the type of work you do. For the most part you can return to a desk job within a week of surgery. If you are more active at work, please discuss this with your doctor.  What to expect after your surgery: You may have a slight burning sensation when you urinate on the first day. You may have a very small amount of blood in the urine. Expect to have a small amount of vaginal discharge/light bleeding for 1-2 weeks. It is not unusual to have abdominal soreness and bruising for up to 2 weeks. You may be tired and need more rest for about 1 week. You may experience shoulder pain for 24-72 hours. Lying flat in bed may relieve it.  Call your doctor for any of the following:  Develop a fever of  100.4 or greater  Inability to urinate 6 hours after discharge from hospital  Severe pain not relieved by pain medications  Persistent of heavy bleeding at incision site  Redness or swelling around incision site after a week  Increasing nausea or vomiting  Patient Signature________________________________________ Nurse Signature_________________________________________

## 2014-03-24 NOTE — Transfer of Care (Signed)
Immediate Anesthesia Transfer of Care Note  Patient: Tracy Branch  Procedure(s) Performed: Procedure(s): LAPAROSCOPY (N/A) INTRAUTERINE DEVICE (IUD) REMOVAL (N/A)  Patient Location: PACU  Anesthesia Type:General  Level of Consciousness: awake, alert  and oriented  Airway & Oxygen Therapy: Patient Spontanous Breathing and Patient connected to nasal cannula oxygen  Post-op Assessment: Report given to PACU RN, Post -op Vital signs reviewed and stable and Patient moving all extremities X 4  Post vital signs: Reviewed and stable  Complications: No apparent anesthesia complications

## 2014-03-24 NOTE — Brief Op Note (Signed)
03/24/2014  2:24 PM  PATIENT:  Tracy Branch  43 y.o. female  PRE-OPERATIVE DIAGNOSIS:  MALPLACED IUD  POST-OPERATIVE DIAGNOSIS:  Malplaced Esure Coil   PROCEDURE:  Procedure(s): LAPAROSCOPY with Esure coil removal (Left); lysis of adhesions  SURGEON:  Surgeon(s) and Role:    * Willodean Rosenthalarolyn Harraway-Smith, MD - Primary  ANESTHESIA:   general  EBL:  Total I/O In: 1100 [I.V.:1100] Out: 85 [Urine:75; Blood:10]  BLOOD ADMINISTERED:none  DRAINS: none   LOCAL MEDICATIONS USED:  MARCAINE     SPECIMEN:  Source of Specimen:  left cornual region Essure coil  DISPOSITION OF SPECIMEN:  PATHOLOGY  COUNTS:  YES  TOURNIQUET:  * No tourniquets in log *  DICTATION: .Note written in EPIC  PLAN OF CARE: Discharge to home after PACU  PATIENT DISPOSITION:  PACU - hemodynamically stable.   Delay start of Pharmacological VTE agent (>24hrs) due to surgical blood loss or risk of bleeding: not applicable

## 2014-03-25 ENCOUNTER — Encounter (HOSPITAL_COMMUNITY): Payer: Self-pay | Admitting: Obstetrics & Gynecology

## 2014-03-31 ENCOUNTER — Encounter: Payer: Self-pay | Admitting: Obstetrics & Gynecology

## 2014-04-22 ENCOUNTER — Encounter: Payer: Self-pay | Admitting: *Deleted

## 2014-04-22 ENCOUNTER — Telehealth: Payer: Self-pay | Admitting: *Deleted

## 2014-04-22 ENCOUNTER — Ambulatory Visit: Payer: BC Managed Care – PPO | Admitting: Obstetrics & Gynecology

## 2014-04-22 NOTE — Telephone Encounter (Signed)
Interpreter called patient and left message about missed appointment. Letter also sent to patient.

## 2014-05-01 ENCOUNTER — Encounter: Payer: Self-pay | Admitting: General Practice

## 2014-06-01 ENCOUNTER — Encounter: Payer: Self-pay | Admitting: General Practice

## 2015-07-23 ENCOUNTER — Emergency Department (HOSPITAL_COMMUNITY): Payer: BLUE CROSS/BLUE SHIELD

## 2015-07-23 ENCOUNTER — Emergency Department (HOSPITAL_COMMUNITY)
Admission: EM | Admit: 2015-07-23 | Discharge: 2015-07-23 | Disposition: A | Discharge status: Home / Self Care | Payer: BLUE CROSS/BLUE SHIELD | Attending: Emergency Medicine | Admitting: Emergency Medicine

## 2015-07-23 ENCOUNTER — Encounter (HOSPITAL_COMMUNITY): Payer: Self-pay | Admitting: Emergency Medicine

## 2015-07-23 DIAGNOSIS — R109 Unspecified abdominal pain: Secondary | ICD-10-CM | POA: Diagnosis present

## 2015-07-23 DIAGNOSIS — Z3202 Encounter for pregnancy test, result negative: Secondary | ICD-10-CM | POA: Insufficient documentation

## 2015-07-23 DIAGNOSIS — Z87440 Personal history of urinary (tract) infections: Secondary | ICD-10-CM | POA: Insufficient documentation

## 2015-07-23 LAB — COMPREHENSIVE METABOLIC PANEL
ALT: 15 U/L (ref 14–54)
ANION GAP: 7 (ref 5–15)
AST: 24 U/L (ref 15–41)
Albumin: 4.4 g/dL (ref 3.5–5.0)
Alkaline Phosphatase: 54 U/L (ref 38–126)
BILIRUBIN TOTAL: 0.5 mg/dL (ref 0.3–1.2)
BUN: 20 mg/dL (ref 6–20)
CO2: 27 mmol/L (ref 22–32)
Calcium: 9.1 mg/dL (ref 8.9–10.3)
Chloride: 101 mmol/L (ref 101–111)
Creatinine, Ser: 0.59 mg/dL (ref 0.44–1.00)
GFR calc Af Amer: >60 mL/min (ref >60)
Glucose, Bld: 103 mg/dL — ABNORMAL HIGH (ref 65–99)
POTASSIUM: 4 mmol/L (ref 3.5–5.1)
Sodium: 135 mmol/L (ref 135–145)
TOTAL PROTEIN: 8.1 g/dL (ref 6.5–8.1)

## 2015-07-23 LAB — CBC WITH DIFFERENTIAL/PLATELET
Basophils Absolute: 0 10*3/uL (ref 0.0–0.1)
Basophils Relative: 0 % (ref 0–1)
Eosinophils Absolute: 0.1 10*3/uL (ref 0.0–0.7)
Eosinophils Relative: 1 % (ref 0–5)
HEMATOCRIT: 36.9 % (ref 36.0–46.0)
Hemoglobin: 12.1 g/dL (ref 12.0–15.0)
LYMPHS PCT: 15 % (ref 12–46)
Lymphs Abs: 1.9 10*3/uL (ref 0.7–4.0)
MCH: 24.9 pg — AB (ref 26.0–34.0)
MCHC: 32.8 g/dL (ref 30.0–36.0)
MCV: 75.9 fL — AB (ref 78.0–100.0)
MONO ABS: 0.6 10*3/uL (ref 0.1–1.0)
MONOS PCT: 4 % (ref 3–12)
NEUTROS ABS: 10.3 10*3/uL — AB (ref 1.7–7.7)
Neutrophils Relative %: 80 % — ABNORMAL HIGH (ref 43–77)
Platelets: 223 10*3/uL (ref 150–400)
RBC: 4.86 MIL/uL (ref 3.87–5.11)
RDW: 13.6 % (ref 11.5–15.5)
WBC: 12.9 10*3/uL — ABNORMAL HIGH (ref 4.0–10.5)

## 2015-07-23 LAB — I-STAT CHEM 8, ED
BUN: 20 mg/dL (ref 6–20)
CALCIUM ION: 1.13 mmol/L (ref 1.12–1.23)
CHLORIDE: 102 mmol/L (ref 101–111)
Creatinine, Ser: 0.7 mg/dL (ref 0.44–1.00)
Glucose, Bld: 101 mg/dL — ABNORMAL HIGH (ref 65–99)
HCT: 41 % (ref 36.0–46.0)
Hemoglobin: 13.9 g/dL (ref 12.0–15.0)
POTASSIUM: 4.1 mmol/L (ref 3.5–5.1)
SODIUM: 138 mmol/L (ref 135–145)
TCO2: 26 mmol/L (ref 0–100)

## 2015-07-23 LAB — URINALYSIS, ROUTINE W REFLEX MICROSCOPIC
BILIRUBIN URINE: NEGATIVE
GLUCOSE, UA: NEGATIVE mg/dL
HGB URINE DIPSTICK: NEGATIVE
KETONES UR: NEGATIVE mg/dL
Leukocytes, UA: NEGATIVE
NITRITE: NEGATIVE
PH: 7 (ref 5.0–8.0)
Protein, ur: NEGATIVE mg/dL
Specific Gravity, Urine: 1.007 (ref 1.005–1.030)
Urobilinogen, UA: 0.2 mg/dL (ref 0.0–1.0)

## 2015-07-23 LAB — POC URINE PREG, ED: PREG TEST UR: NEGATIVE

## 2015-07-23 LAB — LIPASE, BLOOD: LIPASE: 23 U/L (ref 22–51)

## 2015-07-23 MED ORDER — ONDANSETRON HCL 4 MG/2ML IJ SOLN
INTRAMUSCULAR | Status: AC
  Administered 2015-07-23: 4 mg via INTRAVENOUS
  Filled 2015-07-23: qty 2

## 2015-07-23 MED ORDER — SODIUM CHLORIDE 0.9 % IV BOLUS (SEPSIS)
1000.0000 mL | Freq: Once | INTRAVENOUS | Status: AC
  Administered 2015-07-23: 1000 mL via INTRAVENOUS

## 2015-07-23 MED ORDER — MORPHINE SULFATE (PF) 4 MG/ML IV SOLN
4.0000 mg | Freq: Once | INTRAVENOUS | Status: AC
  Administered 2015-07-23: 4 mg via INTRAVENOUS
  Filled 2015-07-23: qty 1

## 2015-07-23 MED ORDER — ONDANSETRON HCL 4 MG/2ML IJ SOLN
4.0000 mg | Freq: Once | INTRAMUSCULAR | Status: AC
  Administered 2015-07-23: 4 mg via INTRAVENOUS

## 2015-07-23 NOTE — ED Provider Notes (Signed)
CSN: [id number]     Arrival date & time 07/23/15  0134 History  This chart was scribed for Tracy Crumble, MD by Tanda Rockers, ED Scribe. This patient was seen in room WA05/WA05 and the patient's care was started at 2:05 AM.  Chief Complaint  Patient presents with  . Generalized Body Aches   The history is provided by the patient and the spouse. The history is limited by a language barrier. No language interpreter was used.     HPI Comments: Tracy Branch is a 44 y.o. female who presents to the Emergency Department complaining of gradual onset, generalized body aches x 2 hours ago. Pt was recently diagnosed with a UTI from Urgent Care after having left flank pain. She began taking Bactrim and Diclofenac around 10 PM last night (4 hours ago), prior to the body aches beginning. Pt states that she is still having flank pain, and has had it for the past week. She rates the flank pain as a 5/10 on the pain scale. Pt mentions that her urine has been white in color tonight as well. She mentions that it was yellow when she was seen at Urgent Care. Denies fever, chills, or any other associated symptoms. FHx kidney stones with brother.    Past Medical History  Diagnosis Date  . Vaginal delivery 1996  . Medical history non-contributory    Past Surgical History  Procedure Laterality Date  . Cesarean section  1999  . Intrauterine device insertion  2012  . Laparoscopy Left 03/24/2014    Procedure: LAPAROSCOPY with Esure coil removal;  Surgeon: Willodean Rosenthal, MD;  Location: [facility];  Service: Gynecology;  Laterality: Left;   History reviewed. No pertinent family history. Social History  Substance Use Topics  . Smoking status: Never Smoker   . Smokeless tobacco: None  . Alcohol Use: No   OB History    No data available     Review of Systems  10 Systems reviewed and all are negative for acute change except as noted in the HPI.   Allergies  Review of patient's allergies indicates  no known allergies.  Home Medications   Prior to Admission medications   Medication Sig Start Date End Date Taking? Authorizing Provider  ibuprofen (ADVIL,MOTRIN) 600 MG tablet Take 1 tablet (600 mg total) by mouth every 6 (six) hours as needed for moderate pain or cramping. 03/24/14   Willodean Rosenthal, MD   Triage Vitals: BP 116/85 mmHg  Pulse 81  Temp(Src) 97.7 F (36.5 C) (Oral)  Resp 18  SpO2 99%  LMP 07/05/2015 (Approximate)   Physical Exam  Constitutional: She is oriented to person, place, and time. She appears well-developed and well-nourished. No distress.  HENT:  Head: Normocephalic and atraumatic.  Nose: Nose normal.  Mouth/Throat: Oropharynx is clear and moist. No oropharyngeal exudate.  Eyes: Conjunctivae and EOM are normal. Pupils are equal, round, and reactive to light. No scleral icterus.  Neck: Normal range of motion. Neck supple. No JVD present. No tracheal deviation present. No thyromegaly present.  Cardiovascular: Normal rate, regular rhythm and normal heart sounds.  Exam reveals no gallop and no friction rub.   No murmur heard. Pulmonary/Chest: Effort normal and breath sounds normal. No respiratory distress. She has no wheezes. She exhibits no tenderness.  Abdominal: Soft. Bowel sounds are normal. She exhibits no distension and no mass. There is no tenderness. There is no rebound and no guarding.  Musculoskeletal: Normal range of motion. She exhibits no edema or tenderness.  Lymphadenopathy:  She has no cervical adenopathy.  Neurological: She is alert and oriented to person, place, and time. No cranial nerve deficit. She exhibits normal muscle tone.  Skin: Skin is warm and dry. No rash noted. No erythema. No pallor.  Nursing note and vitals reviewed.   ED Course  Procedures (including critical care time)  DIAGNOSTIC STUDIES: Oxygen Saturation is 99% on RA, normal by my interpretation.    COORDINATION OF CARE: 2:09 AM-Discussed treatment plan which  includes pain medication and UA with pt at bedside and pt agreed to plan.   Labs Review Labs Reviewed  CBC WITH DIFFERENTIAL/PLATELET - Abnormal; Notable for the following:    WBC 12.9 (*)    MCV 75.9 (*)    MCH 24.9 (*)    Neutrophils Relative % 80 (*)    Neutro Abs 10.3 (*)    All other components within normal limits  COMPREHENSIVE METABOLIC PANEL - Abnormal; Notable for the following:    Glucose, Bld 103 (*)    All other components within normal limits  I-STAT CHEM 8, ED - Abnormal; Notable for the following:    Glucose, Bld 101 (*)    All other components within normal limits  URINE CULTURE  LIPASE, BLOOD  URINALYSIS, ROUTINE W REFLEX MICROSCOPIC (NOT AT [facility])  POC URINE PREG, ED    Imaging Review Ct Renal Stone Study  07/23/2015   CLINICAL DATA:  Initial evaluation for acute left flank pain for 1 week.  EXAM: CT ABDOMEN AND PELVIS WITHOUT CONTRAST  TECHNIQUE: Multidetector CT imaging of the abdomen and pelvis was performed following the standard protocol without IV contrast.  COMPARISON:  Prior CT from 03/18/2014  FINDINGS: Visualized lung bases are clear.  Limited noncontrast evaluation of the liver is unremarkable. Gallbladder within normal limits. No biliary dilatation. Spleen, adrenal glands, and pancreas demonstrate a normal unenhanced appearance.  Kidneys are fairly equal in size without evidence of nephrolithiasis or hydronephrosis. No radiopaque calculi seen along the course of either renal collecting system. There is no hydroureter.  Stomach within normal limits. No evidence for bowel obstruction. No acute inflammatory changes about the bowels. Appendix within normal limits.  Bladder well distended and normal in appearance. Uterus and ovaries within normal limits for patient age.  No free air or fluid. No pathologically enlarged intra-abdominal or pelvic lymph nodes identified.  No acute osseous abnormality. No worrisome lytic or blastic osseous lesions.  IMPRESSION: 1. No CT  evidence for nephrolithiasis or obstructive uropathy. 2. No other acute intra-abdominal or pelvic process identified.   Electronically Signed   By: Rise Mu M.D.   On: 07/23/2015 03:58   I have personally reviewed and evaluated these images and lab results as part of my medical decision-making.   EKG Interpretation None      MDM   Final diagnoses:  None    Patient states she initially presented to urgent care for left-sided flank pain. Only and a urinalysis was obtained which showed positive leuk esterase.  This is not consistent with a urine infection, as she had negative nitrates, and negative bacteria.. Patient may have been expansion kidney stones. We'll obtain laboratory studies and CT scan for evaluation. She was given morphine for pain control. We'll repeat urinalysis to look for infection.   laboratory studies are unremarkable. Urinalysis is negative for infection. CT scan is negative for nephrolithiasis. White blood cell count is 12 which is a nonspecific finding. Patient was advised to stop medications given to her at urgent care. Primary  care follow-up was advised in 3 days. She now appears well and in no acute distress. Vital signs were within her normal limits and she is safe for discharge. Patient is no longer complaining of pain or discomfort.  I personally performed the services described in this documentation, which was scribed in my presence. The recorded information has been reviewed and is accurate.      Tracy Crumble, MD 07/23/15 [phone]

## 2015-07-23 NOTE — Discharge Instructions (Signed)
Flank Pain Ms. Lecount, your blood work and CT scan today were normal.  Your urine does not show any infection.  See your primary care doctor within 3 days for close follow up.  If symptoms worsen, come back to the ED immediately. Thank you.   ??????? [place] , ?????????? ????? ??? CT scan ??????? ???????? ?????????? . ??????? ??????? ?????? ???????????? ??????????? ???????. ???????????? ????????? ???????????? ???? 3 ??? ?????? ?????? ?????????? ???? . ????????? ????? ??????, ???????????? ED ?? ?????????? . ??????. thannang [place]  kanheduaiak leuod lae CT scan khongthan naimuni  daipokkati   padsauaa khongthan bodai saaednghaihen  kantidseu  dainung  boengthanmo  duaelpathom  khongthanphainai vela 3 mu soalab yukai patibadtam thoeng  thahakva akan  sudosm  kabkhunpaibon ED ma  thanthithandai   khob chai  Flank pain is pain in your side. The flank is the area of your side between your upper belly (abdomen) and your back. Pain in this area can be caused by many different things. HOME CARE Home care and treatment will depend on the cause of your pain.  Rest as told by your doctor.  Drink enough fluids to keep your pee (urine) clear or pale yellow.  Only take medicine as told by your doctor.  Tell your doctor about any changes in your pain.  Follow up with your doctor. GET HELP RIGHT AWAY IF:   Your pain does not get better with medicine.   You have new symptoms or your symptoms get worse.  Your pain gets worse.   You have belly (abdominal) pain.   You are short of breath.   You always feel sick to your stomach (nauseous).   You keep throwing up (vomiting).   You have puffiness (swelling) in your belly.   You feel light-headed or you pass out (faint).   You have blood in your pee.  You have a fever or lasting symptoms for more than 2-3 days.  You have a fever and your symptoms suddenly get worse. MAKE SURE YOU:   Understand these  instructions.  Will watch your condition.  Will get help right away if you are not doing well or get worse. Document Released: 08/29/2008 Document Revised: 04/06/2014 Document Reviewed: 07/04/2012 [facility] Patient Information 2015 [place], [place]. This information is not intended to replace advice given to you by your health care provider. Make sure you discuss any questions you have with your health care provider.

## 2015-07-23 NOTE — ED Notes (Signed)
Pt arrived to the ED with a complaint of body aches.  Pt seen at Urgent care today and was prescribed medication for a UTI.  Pt after taking medications at 1000 hrs. Began experiencing body aches.  Pt states the body aches are preventing her from sleeping.

## 2015-07-24 LAB — URINE CULTURE

## 2015-09-07 ENCOUNTER — Other Ambulatory Visit: Payer: Self-pay

## 2015-09-07 DIAGNOSIS — Z1231 Encounter for screening mammogram for malignant neoplasm of breast: Secondary | ICD-10-CM

## 2015-09-09 ENCOUNTER — Ambulatory Visit
Admission: RE | Admit: 2015-09-09 | Discharge: 2015-09-09 | Disposition: A | Discharge status: Home / Self Care | Payer: BLUE CROSS/BLUE SHIELD | Source: Ambulatory Visit

## 2015-09-09 DIAGNOSIS — Z1231 Encounter for screening mammogram for malignant neoplasm of breast: Secondary | ICD-10-CM

## 2016-07-31 ENCOUNTER — Institutional Professional Consult (permissible substitution): Payer: BLUE CROSS/BLUE SHIELD | Admitting: Internal Medicine

## 2016-10-17 ENCOUNTER — Other Ambulatory Visit: Payer: Self-pay | Admitting: Obstetrics and Gynecology

## 2016-10-17 DIAGNOSIS — Z1231 Encounter for screening mammogram for malignant neoplasm of breast: Secondary | ICD-10-CM

## 2016-11-13 ENCOUNTER — Ambulatory Visit: Payer: BLUE CROSS/BLUE SHIELD

## 2017-07-26 IMAGING — CT CT RENAL STONE PROTOCOL
2 of 3 series · 13 of 26 positions shown, 15 images · non-contrast
Comparison: Prior CT from 03/18/2014

CLINICAL DATA: Initial evaluation for acute left flank pain for 1
week.

EXAM:
CT ABDOMEN AND PELVIS WITHOUT CONTRAST
TECHNIQUE: Multidetector CT imaging of the abdomen and pelvis was performed
following the standard protocol without IV contrast.

[Series 3: lung · axial · 0.63mm/px · z∈[+1388,+1432]mm · 10 of 12 slices shown, 12 images]
[im 2/12  soft-tissue]
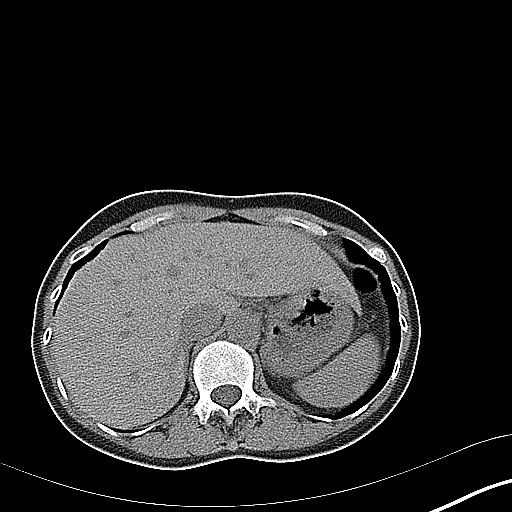
[im 2/12  bone]
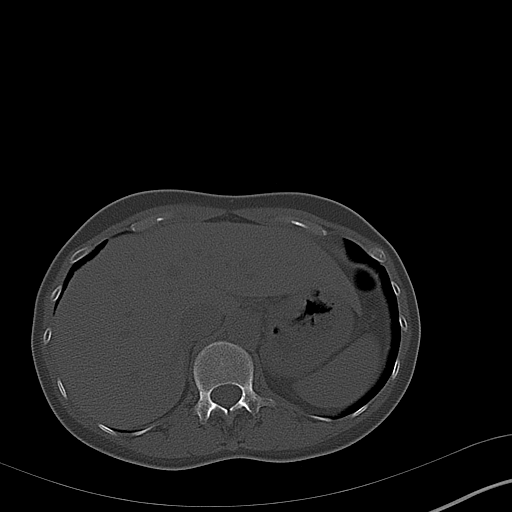
[im 3/12  soft-tissue]
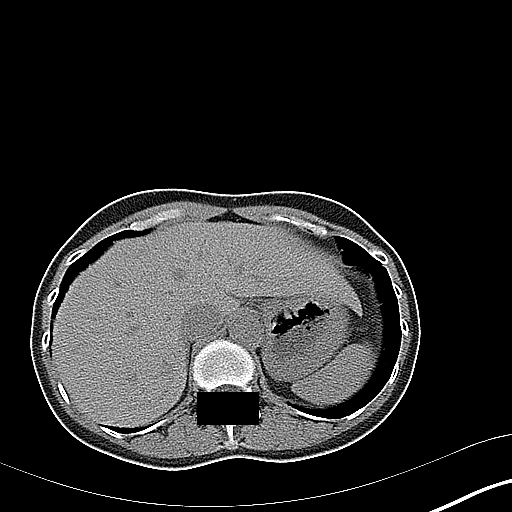
[im 4/12  soft-tissue]
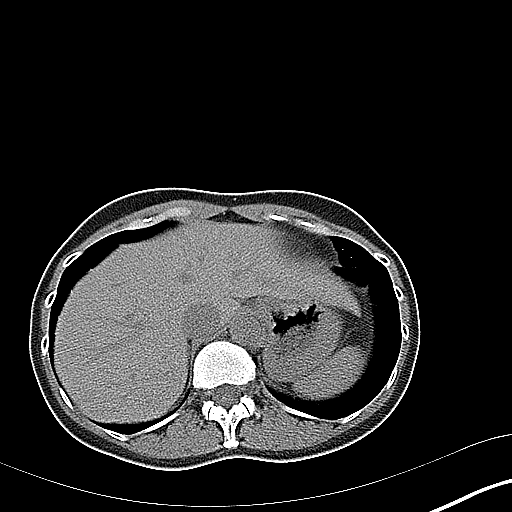
[im 5/12  soft-tissue]
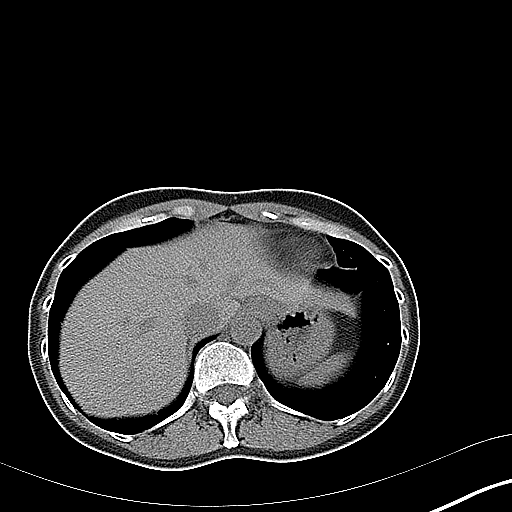
[im 6/12  soft-tissue]
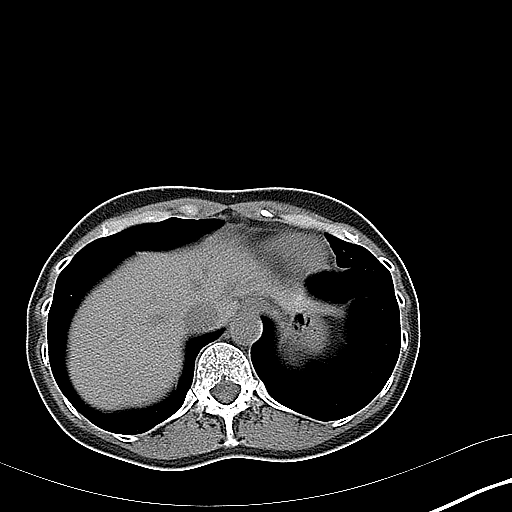
[im 7/12  soft-tissue]
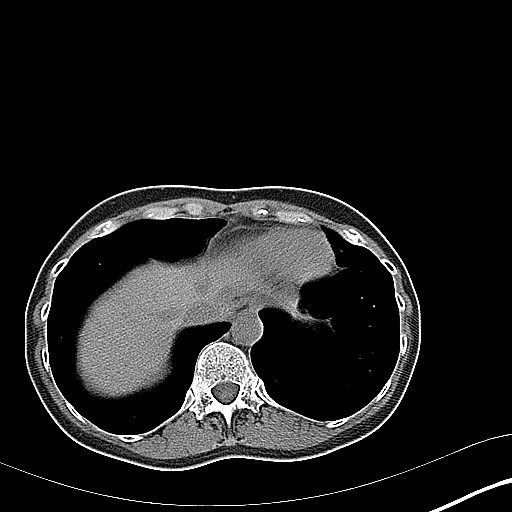
[im 8/12  soft-tissue]
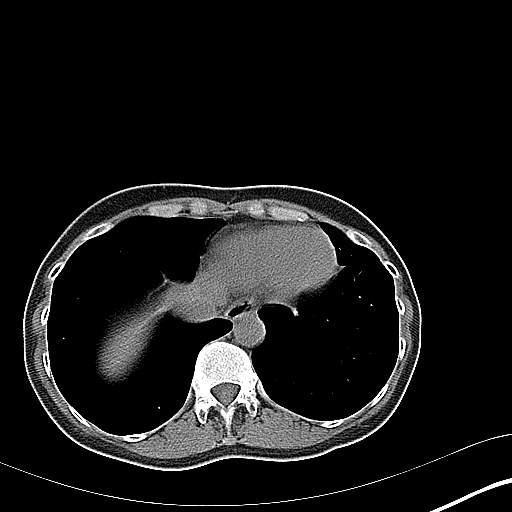
[im 9/12  soft-tissue]
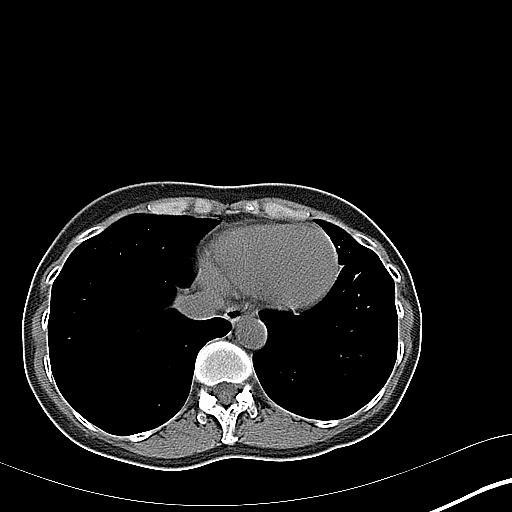
[im 10/12  soft-tissue]
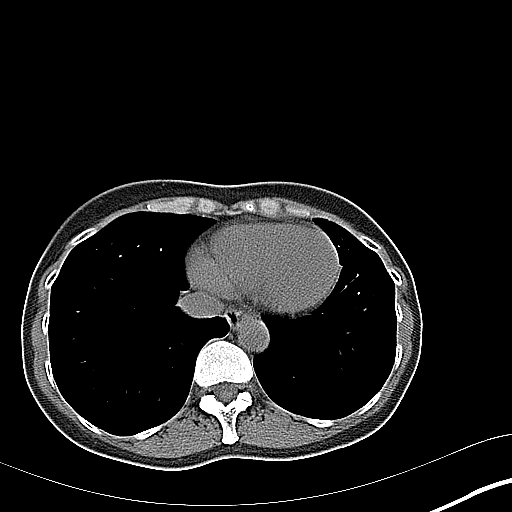
[im 10/12  bone]
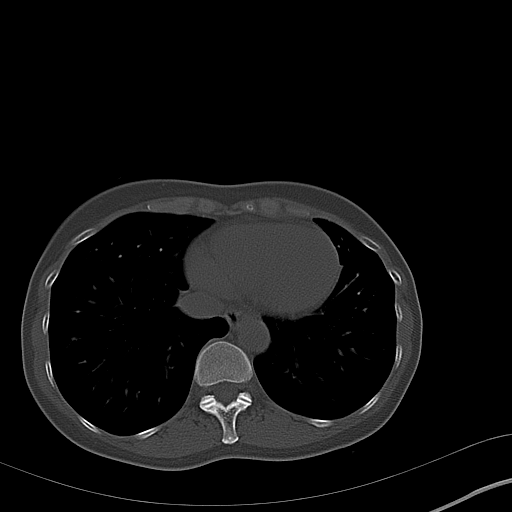
[im 11/12  soft-tissue]
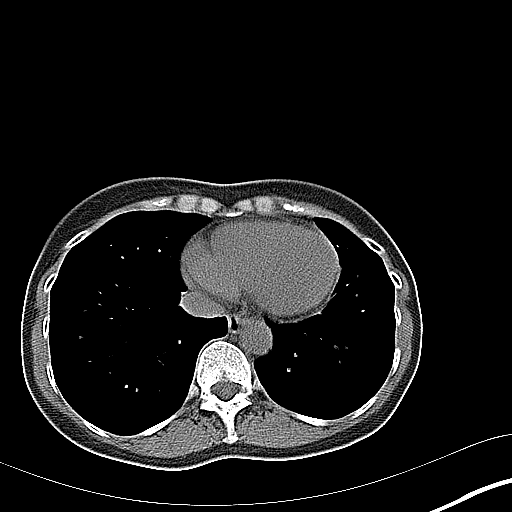

[Series 4: coronal · coronal · 0.57mm/px · 3 of 64 slices shown]
[im 22/64  soft-tissue]
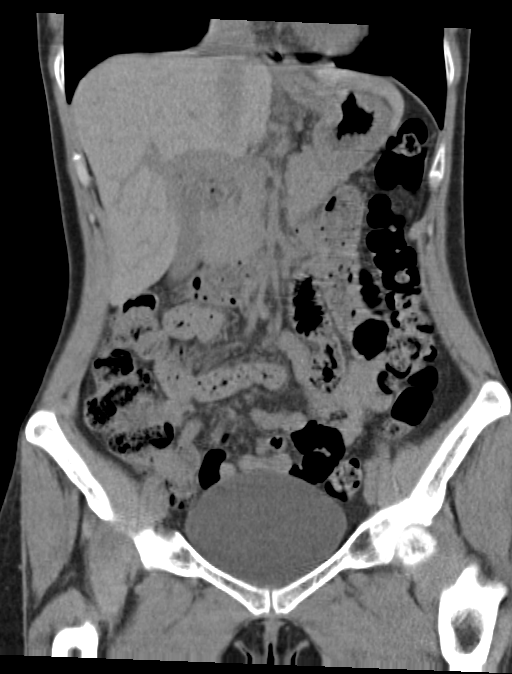
[im 29/64  soft-tissue]
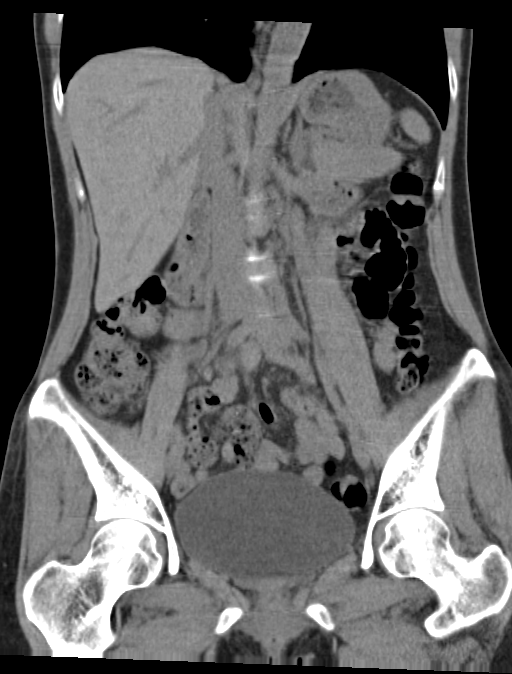
[im 36/64  soft-tissue]
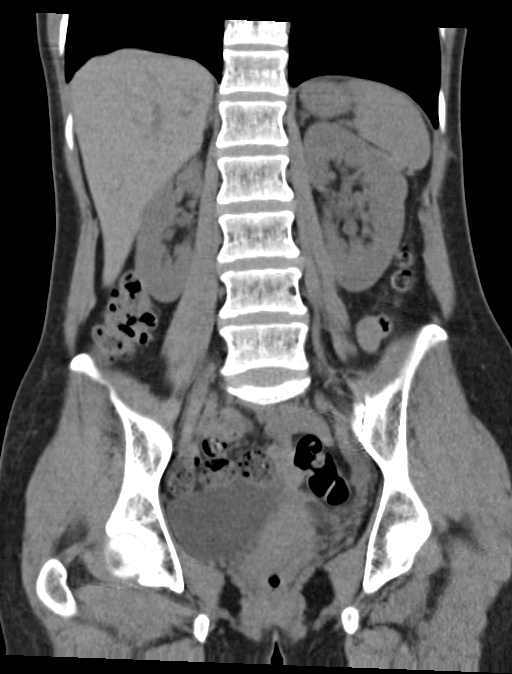

[13 of 26 positions shown; findings below may reference images not displayed]

FINDINGS: Visualized lung bases are clear.

Limited noncontrast evaluation of the liver is unremarkable.
Gallbladder within normal limits. No biliary dilatation. Spleen,
adrenal glands, and pancreas demonstrate a normal unenhanced
appearance.

Kidneys are fairly equal in size without evidence of nephrolithiasis
or hydronephrosis. No radiopaque calculi seen along the course of
either renal collecting system. There is no hydroureter.

Stomach within normal limits. No evidence for bowel obstruction. No
acute inflammatory changes about the bowels. Appendix within normal
limits.

Bladder well distended and normal in appearance. Uterus and ovaries
within normal limits for patient age.

No free air or fluid. No pathologically enlarged intra-abdominal or
pelvic lymph nodes identified.

No acute osseous abnormality. No worrisome lytic or blastic osseous
lesions.
IMPRESSION: 1. No CT evidence for nephrolithiasis or obstructive uropathy.
2. No other acute intra-abdominal or pelvic process identified.

## 2017-08-10 ENCOUNTER — Other Ambulatory Visit: Payer: Self-pay | Admitting: Family Medicine

## 2017-08-10 DIAGNOSIS — E041 Nontoxic single thyroid nodule: Secondary | ICD-10-CM

## 2017-08-20 ENCOUNTER — Ambulatory Visit
Admission: RE | Admit: 2017-08-20 | Discharge: 2017-08-20 | Disposition: A | Discharge status: Home / Self Care | Payer: BLUE CROSS/BLUE SHIELD | Source: Ambulatory Visit | Attending: Family Medicine | Admitting: Family Medicine

## 2017-08-20 DIAGNOSIS — E041 Nontoxic single thyroid nodule: Secondary | ICD-10-CM

## 2017-09-25 ENCOUNTER — Other Ambulatory Visit: Payer: BLUE CROSS/BLUE SHIELD

## 2017-10-02 ENCOUNTER — Ambulatory Visit
Admission: RE | Admit: 2017-10-02 | Discharge: 2017-10-02 | Disposition: A | Discharge status: Home / Self Care | Payer: BLUE CROSS/BLUE SHIELD | Source: Ambulatory Visit | Attending: Family Medicine | Admitting: Family Medicine

## 2017-10-02 ENCOUNTER — Other Ambulatory Visit (HOSPITAL_COMMUNITY)
Admission: RE | Admit: 2017-10-02 | Discharge: 2017-10-02 | Disposition: A | Discharge status: Home / Self Care | Payer: BLUE CROSS/BLUE SHIELD | Source: Ambulatory Visit | Attending: Student | Admitting: Student

## 2017-10-02 DIAGNOSIS — E041 Nontoxic single thyroid nodule: Secondary | ICD-10-CM | POA: Insufficient documentation

## 2019-05-01 IMAGING — US US THYROID
1 series · 12 of 25 positions shown · non-contrast
Comparison: None.

CLINICAL DATA: Goiter.  Thyroid nodule.

EXAM:
THYROID ULTRASOUND
TECHNIQUE: Ultrasound examination of the thyroid gland and adjacent soft
tissues was performed.

[Series 1: us thyroid · 0.04mm/px · 12 of 52 slices shown]
[im 3/52]
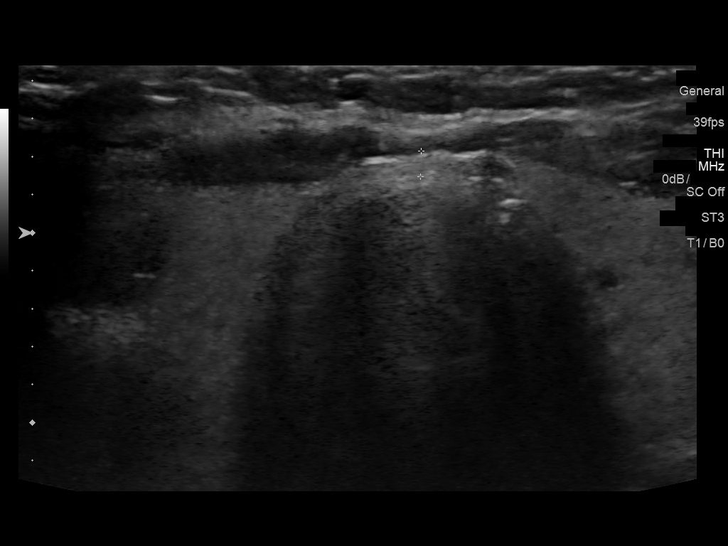
[im 7/52]
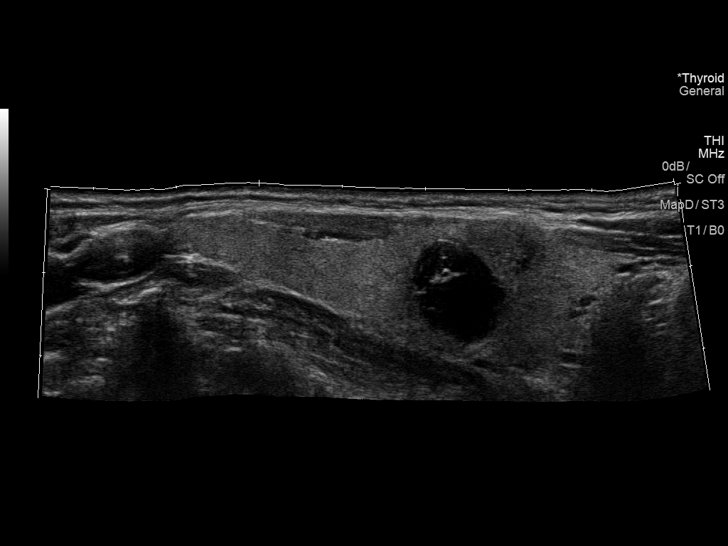
[im 11/52]
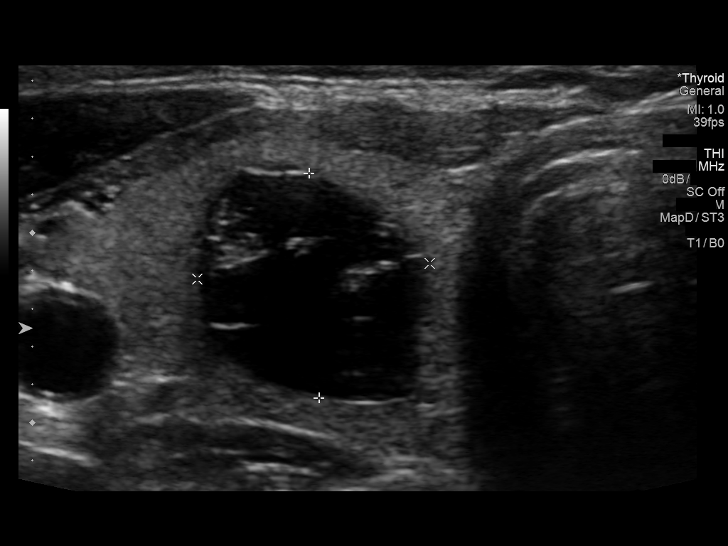
[im 15/52]
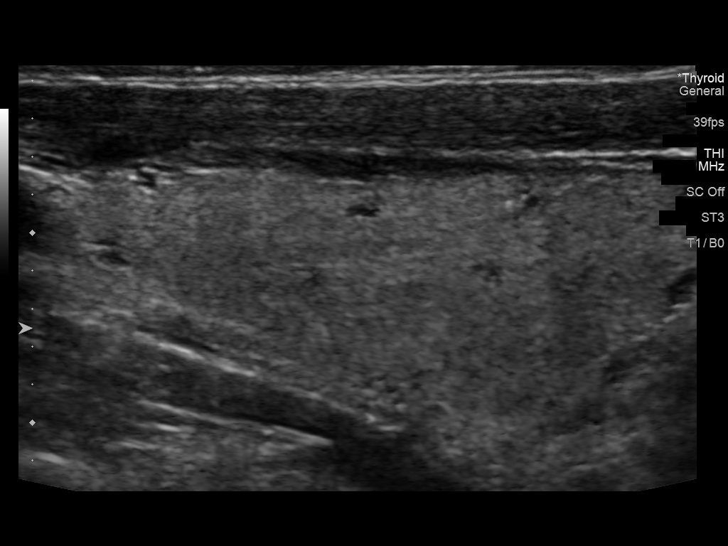
[im 20/52]
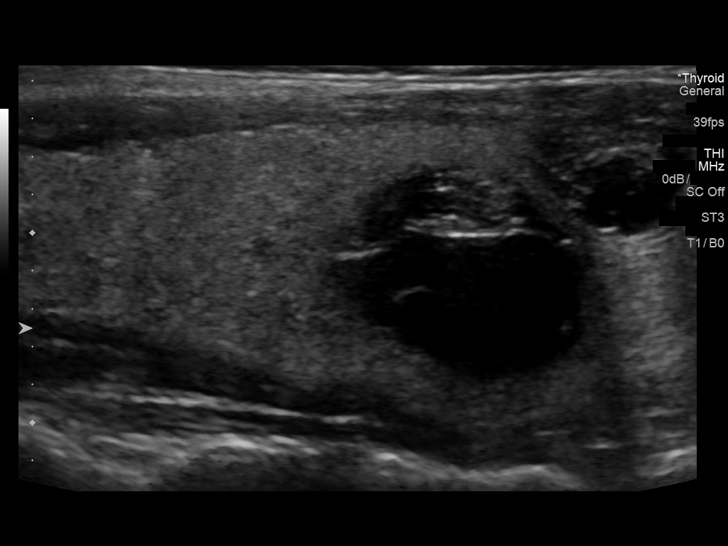
[im 24/52]
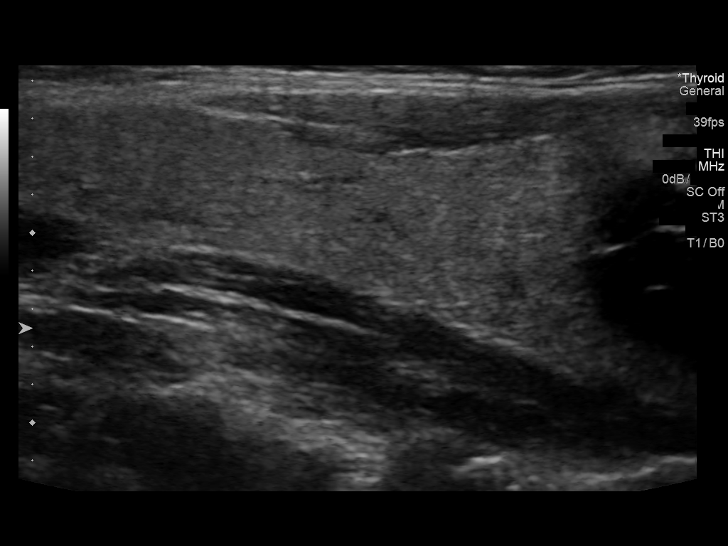
[im 28/52]
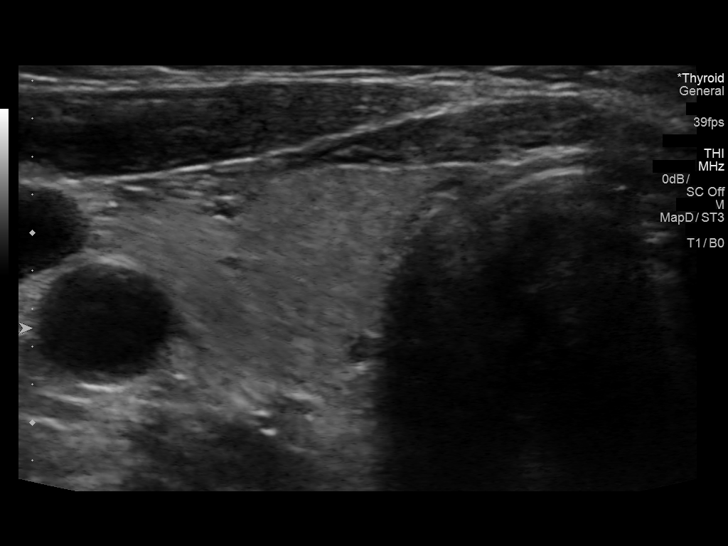
[im 32/52]
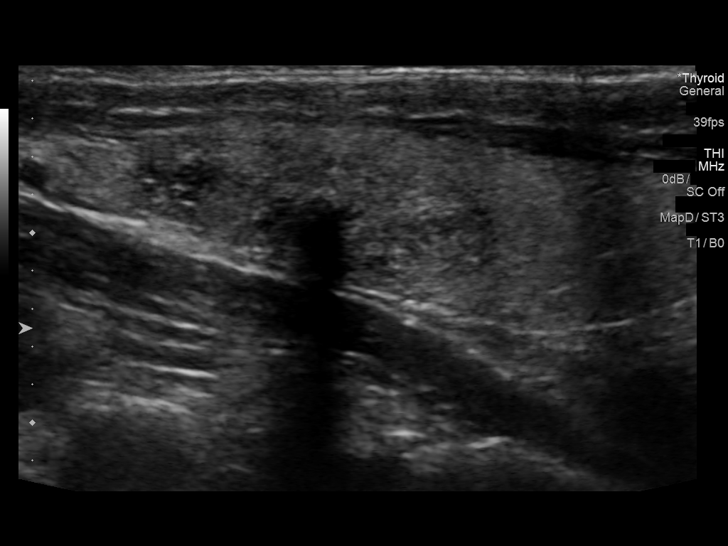
[im 37/52]
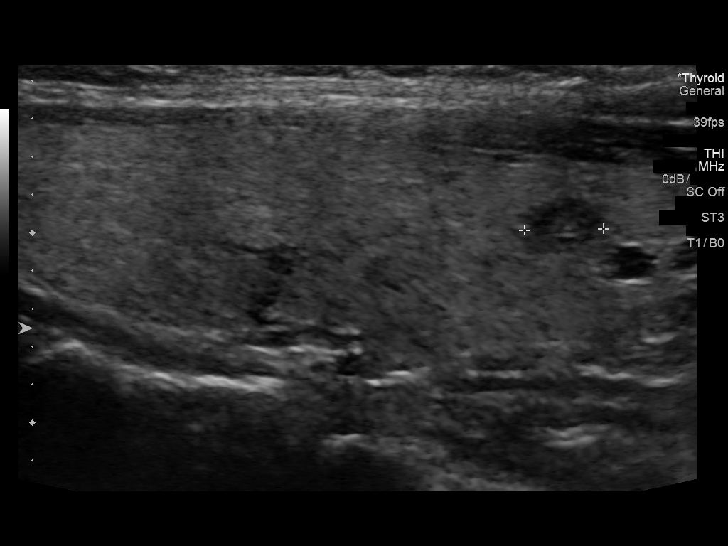
[im 41/52]
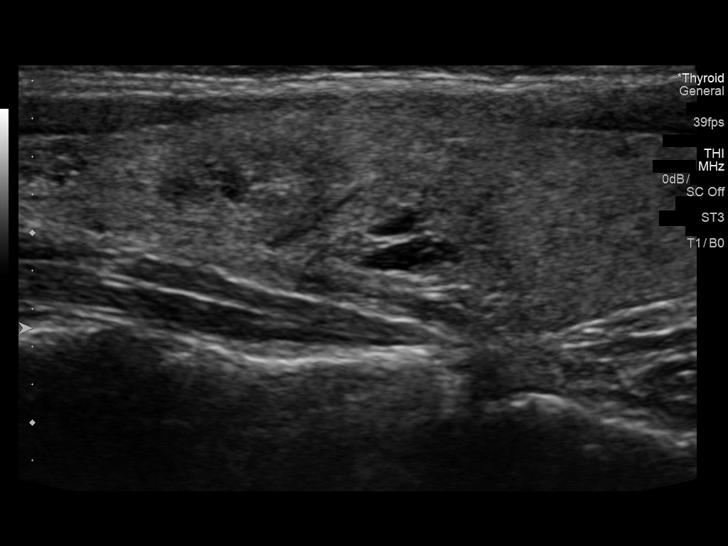
[im 45/52]
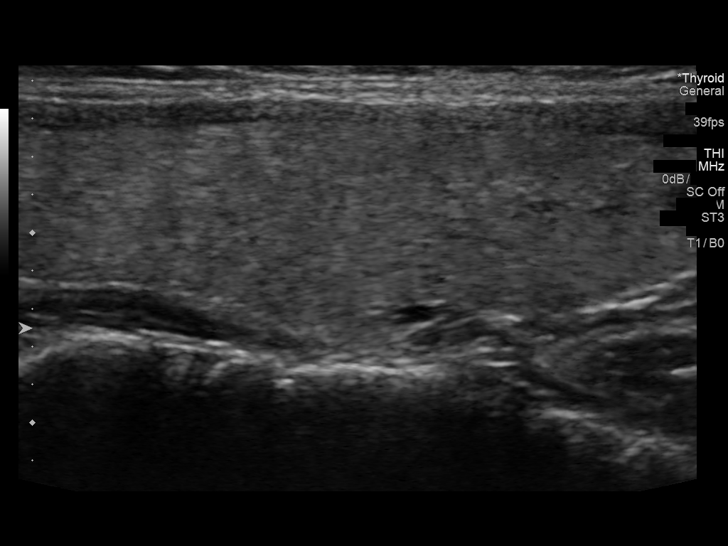
[im 49/52]
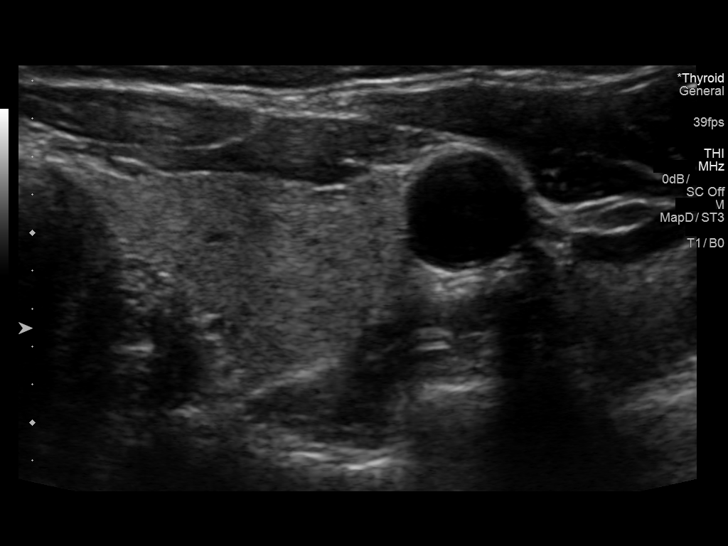

[12 of 25 positions shown; findings below may reference images not displayed]

FINDINGS: Parenchymal Echotexture: Mildly heterogenous

Isthmus: 0.1 cm

Right lobe: 5.6 x 1.8 x 1.9 cm

Left lobe: 5.5 x 1.6 x 1.9 cm

_________________________________________________________

Estimated total number of nodules >/= 1 cm: 3

Number of spongiform nodules >/=  2 cm not described below (TR1): 0

Number of mixed cystic and solid nodules >/= 1.5 cm not described
below (TR2): 0

_________________________________________________________

Nodule # 1:

Location: Right; Mid

Maximum size: 1.6 cm; Other 2 dimensions: 1.2 x 1.2 cm

Composition: cystic/almost completely cystic (0)

Echogenicity: anechoic (0)

Shape: not taller-than-wide (0)

Margins: smooth (0)

Echogenic foci: none (0)

ACR TI-RADS total points: 0.

ACR TI-RADS risk category: TR1 (0-1 points).

ACR TI-RADS recommendations:

This nodule does NOT meet TI-RADS criteria for biopsy or dedicated
follow-up.

_________________________________________________________

Nodule # 2:

Location: Right; Mid

Maximum size: 1.0 cm; Other 2 dimensions: 0.6 x 0.9 cm

Composition: spongiform (0)

Echogenicity: anechoic (0)

Shape: not taller-than-wide (0)

Margins: smooth (0)

Echogenic foci: none (0)

ACR TI-RADS total points: 0.

ACR TI-RADS risk category: TR1 (0-1 points).

ACR TI-RADS recommendations:

This nodule does NOT meet TI-RADS criteria for biopsy or dedicated
follow-up.

_________________________________________________________

Nodule # 3:

Location: Left; Superior

Maximum size: 0.9 cm; Other 2 dimensions: 0.6 x 0.7 cm

Composition: spongiform (0)

Echogenicity: anechoic (0)

Shape: not taller-than-wide (0)

Margins: smooth (0)

Echogenic foci: none (0)

ACR TI-RADS total points: 0.

ACR TI-RADS risk category: TR1 (0-1 points).

ACR TI-RADS recommendations:

This nodule does NOT meet TI-RADS criteria for biopsy or dedicated
follow-up.

_________________________________________________________

Nodule # 4:

Location: Left; Inferior

Maximum size: 1.9 cm; Other 2 dimensions: 1.0 x 1.1 cm

Composition: solid/almost completely solid (2)

Echogenicity: isoechoic (1)

Shape: not taller-than-wide (0)

Margins: ill-defined (0)

Echogenic foci: macrocalcifications (1)

ACR TI-RADS total points: 4.

ACR TI-RADS risk category: TR4 (4-6 points).

ACR TI-RADS recommendations:

**Given size (>/= 1.5 cm) and appearance, fine needle aspiration of
this moderately suspicious nodule should be considered based on
TI-RADS criteria.

_________________________________________________________
IMPRESSION: Four measurable thyroid nodules bilaterally. Three are clearly
benign and do not require biopsy or further follow-up. A left
inferior nodule measuring roughly 1.9 cm in greatest dimensions is
very ill-defined and difficult to accurately measure. However, based
on categorization, this nodule does meet criteria for fine-needle
aspiration.

The above is in keeping with the ACR TI-RADS recommendations - [HOSPITAL] 0786;[DATE].

## 2021-11-01 ENCOUNTER — Encounter: Payer: Self-pay | Admitting: Obstetrics and Gynecology

## 2021-11-01 ENCOUNTER — Ambulatory Visit (INDEPENDENT_AMBULATORY_CARE_PROVIDER_SITE_OTHER): Payer: BLUE CROSS/BLUE SHIELD | Admitting: Obstetrics and Gynecology

## 2021-11-01 ENCOUNTER — Other Ambulatory Visit (HOSPITAL_COMMUNITY)
Admission: RE | Admit: 2021-11-01 | Discharge: 2021-11-01 | Disposition: A | Discharge status: Home / Self Care | Payer: 59 | Source: Ambulatory Visit | Attending: Obstetrics and Gynecology | Admitting: Obstetrics and Gynecology

## 2021-11-01 ENCOUNTER — Other Ambulatory Visit: Payer: Self-pay

## 2021-11-01 VITALS — BP 119/78 | HR 87 | Ht 59.0 in | Wt 111.8 lb

## 2021-11-01 DIAGNOSIS — Z01419 Encounter for gynecological examination (general) (routine) without abnormal findings: Secondary | ICD-10-CM

## 2021-11-01 NOTE — Progress Notes (Signed)
Subjective:     Tracy Branch is a 50 y.o. female  with BMI 22 postmenopausal for 3 years who is here for a comprehensive physical exam. The patient reports no problems. She is not sexually active. She denies pelvic pain or abnormal discharge. She denies urinary incontinence or vasomotor symptoms. Patient is without any complaints  Past Medical History:  Diagnosis Date   Medical history non-contributory    Vaginal delivery 1996   Past Surgical History:  Procedure Laterality Date   CESAREAN SECTION  1999   INTRAUTERINE DEVICE INSERTION  2012   LAPAROSCOPY Left 03/24/2014   Procedure: LAPAROSCOPY with Esure coil removal;  Surgeon: Willodean Rosenthal, MD;  Location: [facility];  Service: Gynecology;  Laterality: Left;   No family history on file.   Social History   Socioeconomic History   Marital status: Married    Spouse name: Not on file   Number of children: Not on file   Years of education: Not on file   Highest education level: Not on file  Occupational History   Not on file  Tobacco Use   Smoking status: Never   Smokeless tobacco: Not on file  Substance and Sexual Activity   Alcohol use: No   Drug use: No   Sexual activity: Yes  Other Topics Concern   Not on file  Social History Narrative   Not on file   Social Determinants of Health   Financial Resource Strain: Not on file  Food Insecurity: Not on file  Transportation Needs: Not on file  Physical Activity: Not on file  Stress: Not on file  Social Connections: Not on file  Intimate Partner Violence: Not on file   Health Maintenance  Topic Date Due   HIV Screening  Never done   Hepatitis C Screening  Never done   PAP SMEAR-Modifier  04/05/2014   COLONOSCOPY (Pts 45-85yrs Insurance coverage will need to be confirmed)  Never done   TETANUS/TDAP  12/05/2015   MAMMOGRAM  06/13/2020   Zoster Vaccines- Shingrix (1 of 2) Never done   INFLUENZA VACCINE  07/04/2021   Pneumococcal Vaccine 46-69 Years old   Aged Out   HPV VACCINES  Aged Out       Review of Systems Pertinent items noted in HPI and remainder of comprehensive ROS otherwise negative.   Objective:  Blood pressure 119/78, pulse 87, height 4\' 11"  (1.499 m), weight 111 lb 12.8 oz (50.7 kg).   GENERAL: Well-developed, well-nourished female in no acute distress.  HEENT: Normocephalic, atraumatic. Sclerae anicteric.  NECK: Supple. Normal thyroid.  LUNGS: Clear to auscultation bilaterally.  HEART: Regular rate and rhythm. BREASTS: Symmetric in size. No palpable masses or lymphadenopathy, skin changes, or nipple drainage. ABDOMEN: Soft, nontender, nondistended. No organomegaly. PELVIC: Normal external female genitalia. Vagina is pink and rugated.  Normal discharge. Normal appearing cervix. Uterus is normal in size. No adnexal mass or tenderness. Chaperone present during the pelvic exam EXTREMITIES: No cyanosis, clubbing, or edema, 2+ distal pulses.     Assessment:    Healthy female exam.      Plan:    Pap smear collected Screening mammogram normal 10/2021 Colonoscopy ordered Health maintenance labs ordered Patient will be contacted with abnormal results See After Visit Summary for Counseling Recommendations

## 2021-11-02 ENCOUNTER — Telehealth: Payer: Self-pay

## 2021-11-02 LAB — CBC
Hematocrit: 35.7 % (ref 34.0–46.6)
Hemoglobin: 11.8 g/dL (ref 11.1–15.9)
MCH: 24.7 pg — ABNORMAL LOW (ref 26.6–33.0)
MCHC: 33.1 g/dL (ref 31.5–35.7)
MCV: 75 fL — ABNORMAL LOW (ref 79–97)
Platelets: 280 10*3/uL (ref 150–450)
RBC: 4.78 x10E6/uL (ref 3.77–5.28)
RDW: 14 % (ref 11.7–15.4)
WBC: 6.6 10*3/uL (ref 3.4–10.8)

## 2021-11-02 LAB — COMPREHENSIVE METABOLIC PANEL
ALT: 15 IU/L (ref 0–32)
AST: 23 IU/L (ref 0–40)
Albumin/Globulin Ratio: 1.7 (ref 1.2–2.2)
Albumin: 4.8 g/dL (ref 3.8–4.9)
Alkaline Phosphatase: 102 IU/L (ref 44–121)
BUN/Creatinine Ratio: 16 (ref 9–23)
BUN: 12 mg/dL (ref 6–24)
Bilirubin Total: 0.2 mg/dL (ref 0.0–1.2)
CO2: 25 mmol/L (ref 20–29)
Calcium: 9.3 mg/dL (ref 8.7–10.2)
Chloride: 103 mmol/L (ref 96–106)
Creatinine, Ser: 0.75 mg/dL (ref 0.57–1.00)
Globulin, Total: 2.9 g/dL (ref 1.5–4.5)
Glucose: 91 mg/dL (ref 70–99)
Potassium: 4.1 mmol/L (ref 3.5–5.2)
Sodium: 142 mmol/L (ref 134–144)
Total Protein: 7.7 g/dL (ref 6.0–8.5)
eGFR: 96 mL/min/{1.73_m2} (ref >59)

## 2021-11-02 LAB — LIPID PANEL
Chol/HDL Ratio: 3.1 ratio (ref 0.0–4.4)
Cholesterol, Total: 248 mg/dL — ABNORMAL HIGH (ref 100–199)
HDL: 81 mg/dL (ref >39)
LDL Chol Calc (NIH): 155 mg/dL — ABNORMAL HIGH (ref 0–99)
Triglycerides: 74 mg/dL (ref 0–149)
VLDL Cholesterol Cal: 12 mg/dL (ref 5–40)

## 2021-11-02 LAB — TSH: TSH: 2.52 u[IU]/mL (ref 0.450–4.500)

## 2021-11-02 LAB — HEMOGLOBIN A1C
Est. average glucose Bld gHb Est-mCnc: 120 mg/dL
Hgb A1c MFr Bld: 5.8 % — ABNORMAL HIGH (ref 4.8–5.6)

## 2021-11-02 NOTE — Telephone Encounter (Addendum)
Attempted to call pt using interpreter line. Interpreter ID: [id number]. Pt did not answer and no option to leave voicemail.   ----- Message from Catalina Antigua, MD sent at 11/02/2021  6:20 AM EST ----- Please inform patient that her diabetes screening indicates that she is pre-diabetic. Her cholesterol is also elevated. Consuming a fiber rich diet and exercising at least 150 minutes per week (the equivalent of a five 30-minute walks) will help improve her values and hopefully avoid a diagnosis of diabetes and medication management  Thanks  Gigi Gin

## 2021-11-03 LAB — CYTOLOGY - PAP
Comment: NEGATIVE
Diagnosis: NEGATIVE
High risk HPV: NEGATIVE
